# Patient Record
Sex: Female | Born: 2000 | Race: Black or African American | Hispanic: No | Marital: Single | Smoking: Current every day smoker
Health system: Southern US, Community
[De-identification: ages and names within clinical notes are randomized; demographics above are authoritative.]

## PROBLEM LIST (undated history)

## (undated) DIAGNOSIS — I10 Essential (primary) hypertension: Secondary | ICD-10-CM

## (undated) DIAGNOSIS — T7840XA Allergy, unspecified, initial encounter: Secondary | ICD-10-CM

## (undated) DIAGNOSIS — E119 Type 2 diabetes mellitus without complications: Secondary | ICD-10-CM

## (undated) HISTORY — DX: Allergy, unspecified, initial encounter: T78.40XA

---
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
## 2019-03-28 DIAGNOSIS — J309 Allergic rhinitis, unspecified: Secondary | ICD-10-CM | POA: Diagnosis not present

## 2019-03-28 DIAGNOSIS — R439 Unspecified disturbances of smell and taste: Secondary | ICD-10-CM | POA: Diagnosis not present

## 2019-03-28 DIAGNOSIS — R05 Cough: Secondary | ICD-10-CM | POA: Diagnosis not present

## 2019-03-28 DIAGNOSIS — Z20828 Contact with and (suspected) exposure to other viral communicable diseases: Secondary | ICD-10-CM | POA: Diagnosis not present

## 2019-08-07 ENCOUNTER — Ambulatory Visit: Payer: Medicaid Other | Admitting: Adult Health

## 2019-08-30 ENCOUNTER — Ambulatory Visit: Payer: Medicaid Other | Admitting: Adult Health

## 2019-10-03 ENCOUNTER — Ambulatory Visit: Payer: Medicaid Other | Admitting: Adult Health

## 2019-11-08 ENCOUNTER — Other Ambulatory Visit: Payer: Self-pay

## 2019-11-08 ENCOUNTER — Ambulatory Visit (INDEPENDENT_AMBULATORY_CARE_PROVIDER_SITE_OTHER): Payer: Medicaid Other | Admitting: Adult Health

## 2019-11-08 ENCOUNTER — Other Ambulatory Visit (HOSPITAL_COMMUNITY)
Admission: RE | Admit: 2019-11-08 | Discharge: 2019-11-08 | Disposition: A | Discharge status: Home / Self Care | Payer: Medicaid Other | Source: Ambulatory Visit | Attending: Adult Health | Admitting: Adult Health

## 2019-11-08 ENCOUNTER — Encounter: Payer: Self-pay | Admitting: Adult Health

## 2019-11-08 VITALS — BP 105/66 | HR 90 | Temp 97.7°F | Resp 16 | Ht 65.0 in | Wt 304.0 lb

## 2019-11-08 DIAGNOSIS — Z113 Encounter for screening for infections with a predominantly sexual mode of transmission: Secondary | ICD-10-CM

## 2019-11-08 DIAGNOSIS — N898 Other specified noninflammatory disorders of vagina: Secondary | ICD-10-CM | POA: Diagnosis not present

## 2019-11-08 DIAGNOSIS — Z01419 Encounter for gynecological examination (general) (routine) without abnormal findings: Secondary | ICD-10-CM | POA: Insufficient documentation

## 2019-11-08 DIAGNOSIS — B9689 Other specified bacterial agents as the cause of diseases classified elsewhere: Secondary | ICD-10-CM | POA: Diagnosis not present

## 2019-11-08 DIAGNOSIS — N760 Acute vaginitis: Secondary | ICD-10-CM | POA: Insufficient documentation

## 2019-11-08 DIAGNOSIS — E559 Vitamin D deficiency, unspecified: Secondary | ICD-10-CM

## 2019-11-08 DIAGNOSIS — Z1389 Encounter for screening for other disorder: Secondary | ICD-10-CM

## 2019-11-08 DIAGNOSIS — R7309 Other abnormal glucose: Secondary | ICD-10-CM | POA: Diagnosis not present

## 2019-11-08 DIAGNOSIS — R5383 Other fatigue: Secondary | ICD-10-CM | POA: Diagnosis not present

## 2019-11-08 DIAGNOSIS — Z13220 Encounter for screening for lipoid disorders: Secondary | ICD-10-CM | POA: Diagnosis not present

## 2019-11-08 DIAGNOSIS — R82998 Other abnormal findings in urine: Secondary | ICD-10-CM | POA: Diagnosis not present

## 2019-11-08 DIAGNOSIS — Z6841 Body Mass Index (BMI) 40.0 and over, adult: Secondary | ICD-10-CM

## 2019-11-08 LAB — POCT URINALYSIS DIPSTICK
Appearance: ABNORMAL
Bilirubin, UA: NEGATIVE
Glucose, UA: NEGATIVE
Ketones, UA: NEGATIVE
Nitrite, UA: NEGATIVE
Odor: NORMAL
Protein, UA: NEGATIVE
Spec Grav, UA: 1.01 (ref 1.010–1.025)
Urobilinogen, UA: 0.2 E.U./dL
pH, UA: 6 (ref 5.0–8.0)

## 2019-11-08 MED ORDER — METRONIDAZOLE 0.75 % VA GEL: 1.0000 | Freq: Every day | VAGINAL | 0 refills | Status: AC

## 2019-11-08 NOTE — Patient Instructions (Addendum)
Nice to meet you today.  Remember you can go or fasting labs at our lab on the same floor as our office from 8 am to 4pm  Monday to Friday closed for lunch at 12- 120 and closed major holidays.   Return for full physical by appointment only and if symptoms persist change or worsen call for follow up at anytime.   Metronidazole vaginal gel What is this medicine? METRONIDAZOLE (me troe NI da zole) VAGINAL GEL is an antiinfective. It is used to treat bacterial vaginitis. This medicine may be used for other purposes; ask your health care provider or pharmacist if you have questions. COMMON BRAND NAME(S): MetroGel, MetroGel Vaginal, MetroGel-Vaginal, NUVESSA, Vandazole What should I tell my health care provider before I take this medicine? They need to know if you have any of these conditions:  Cockayne syndrome  history of blood diseases, like sickle cell disease or leukemia  history of yeast infection  if you often drink alcohol  liver disease  an unusual or allergic reaction to metronidazole, nitroimidazoles, parabens, or other medicines, foods, dyes, or preservatives  pregnant or trying to get pregnant  breast-feeding How should I use this medicine? This medicine is only for use in the vagina. Do not take by mouth or apply to other areas of the body. Follow the directions on the prescription label. Wash hands before and after use. Screw the applicator to the tube and squeeze the tube gently to fill the applicator. Lie on your back, part and bend your knees. Insert the applicator tip high in the vagina and push the plunger to release the gel into the vagina. Gently remove the applicator. Wash the applicator well with warm water and soap. Use at regular intervals. Finish the full course prescribed by your doctor or health care professional even if you think your condition is better. Do not stop using except on the advice of your doctor or health care professional. Talk to your pediatrician  regarding the use of this medicine in children. While this drug maybe prescribed for children as young as 12 years for selected conditions, precautions do apply. Overdosage: If you think you have taken too much of this medicine contact a poison control center or emergency room at once. NOTE: This medicine is only for you. Do not share this medicine with others. What if I miss a dose? If you miss a dose, use it as soon as you can. If it is almost time for your next dose, use only that dose. Do not use double or extra doses. What may interact with this medicine? Do not take this medicine with any of the following medications:  alcohol or any product that contains alcohol  cisapride  disulfiram  dronedarone  pimozide  thioridazine This medicine may also interact with the following medications:  amiodarone  birth control pills  busulfan  carbamazepine  cimetidine  cyclosporine  fluorouracil  lithium  other medicines that prolong the QT interval (cause an abnormal heart rhythm) like dofetilide, ziprasidone  phenobarbital  phenytoin  quinidine  tacrolimus  vecuronium  warfarin This list may not describe all possible interactions. Give your health care provider a list of all the medicines, herbs, non-prescription drugs, or dietary supplements you use. Also tell them if you smoke, drink alcohol, or use illegal drugs. Some items may interact with your medicine. What should I watch for while using this medicine? Tell your doctor or health care professional if your symptoms do not improve or if they get  worse. You may get drowsy or dizzy. Do not drive, use machinery, or do anything that needs mental alertness until you know how this medicine affects you. Do not stand or sit up quickly, especially if you are an older patient. This reduces the risk of dizzy or fainting spells. Ask your doctor or health care professional if you should avoid alcohol. Many nonprescription cough  and cold products contain alcohol. Metronidazole can cause an unpleasant reaction when taken with alcohol. The reaction includes flushing, headache, nausea, vomiting, sweating, and increased thirst. The reaction can last from 30 minutes to several hours. If you are being treated for a sexually transmitted disease, avoid sexual contact until you have finished your treatment. Your sexual partner may also need treatment. What side effects may I notice from receiving this medicine? Side effects that you should report to your doctor or health care professional as soon as possible:  allergic reactions like skin rash, itching or hives, swelling of the face, lips, or tongue  confusion  fast, irregular heartbeat  fever, chills, sore throat  fever with rash, swollen lymph nodes, or swelling of the face  pain, tingling, numbness in the hands or feet  redness, blistering, peeling or loosening of the skin, including inside the mouth  seizures  sign and symptoms of liver injury like dark yellow or brown urine; general ill feeling or flu-like symptoms; light colored stools; loss of appetite; nausea; right upper belly pain; unusually weak or tired; yellowing of the eyes or skin  vaginal discharge, itching, or odor in women Side effects that usually do not require medical attention (report to your doctor or health care professional if they continue or are bothersome):  changes in taste  diarrhea  headache  nausea, vomiting  stomach pain This list may not describe all possible side effects. Call your doctor for medical advice about side effects. You may report side effects to FDA at 1-800-FDA-1088. Where should I keep my medicine? Keep out of the reach of children. Store at room temperature between 15 and 30 degrees C (59 and 86 degrees F). Do not freeze. Throw away any unused medicine after the expiration date. NOTE: This sheet is a summary. It may not cover all possible information. If you  have questions about this medicine, talk to your doctor, pharmacist, or health care provider.  2020 Elsevier/Gold Standard (2018-05-15 06:53:27)  Bacterial Vaginosis  Bacterial vaginosis is an infection of the vagina. It happens when too many normal germs (healthy bacteria) grow in the vagina. This infection puts you at risk for infections from sex (STIs). Treating this infection can lower your risk for some STIs. You should also treat this if you are pregnant. It can cause your baby to be born early. Follow these instructions at home: Medicines  Take over-the-counter and prescription medicines only as told by your doctor.  Take or use your antibiotic medicine as told by your doctor. Do not stop taking or using it even if you start to feel better. General instructions  If you your sexual partner is a woman, tell her that you have this infection. She needs to get treatment if she has symptoms. If you have a female partner, he does not need to be treated.  During treatment: ? Avoid sex. ? Do not douche. ? Avoid alcohol as told. ? Avoid breastfeeding as told.  Drink enough fluid to keep your pee (urine) clear or pale yellow.  Keep your vagina and butt (rectum) clean. ? Wash the area with  warm water every day. ? Wipe from front to back after you use the toilet.  Keep all follow-up visits as told by your doctor. This is important. Preventing this condition  Do not douche.  Use only warm water to wash around your vagina.  Use protection when you have sex. This includes: ? Latex condoms. ? Dental dams.  Limit how many people you have sex with. It is best to only have sex with the same person (be monogamous).  Get tested for STIs. Have your partner get tested.  Wear underwear that is cotton or lined with cotton.  Avoid tight pants and pantyhose. This is most important in summer.  Do not use any products that have nicotine or tobacco in them. These include cigarettes and  e-cigarettes. If you need help quitting, ask your doctor.  Do not use illegal drugs.  Limit how much alcohol you drink. Contact a doctor if:  Your symptoms do not get better, even after you are treated.  You have more discharge or pain when you pee (urinate).  You have a fever.  You have pain in your belly (abdomen).  You have pain with sex.  Your bleed from your vagina between periods. Summary  This infection happens when too many germs (bacteria) grow in the vagina.  Treating this condition can lower your risk for some infections from sex (STIs).  You should also treat this if you are pregnant. It can cause early (premature) birth.  Do not stop taking or using your antibiotic medicine even if you start to feel better. This information is not intended to replace advice given to you by your health care provider. Make sure you discuss any questions you have with your health care provider. Document Revised: 05/05/2017 Document Reviewed: 02/06/2016 Elsevier Patient Education  2020 [employer].   Health Maintenance, Female Adopting a healthy lifestyle and getting preventive care are important in promoting health and wellness. Ask your health care provider about:  The right schedule for you to have regular tests and exams.  Things you can do on your own to prevent diseases and keep yourself healthy. What should I know about diet, weight, and exercise? Eat a healthy diet   Eat a diet that includes plenty of vegetables, fruits, low-fat dairy products, and lean protein.  Do not eat a lot of foods that are high in solid fats, added sugars, or sodium. Maintain a healthy weight Body mass index (BMI) is used to identify weight problems. It estimates body fat based on height and weight. Your health care provider can help determine your BMI and help you achieve or maintain a healthy weight. Get regular exercise Get regular exercise. This is one of the most important things you can  do for your health. Most adults should:  Exercise for at least 150 minutes each week. The exercise should increase your heart rate and make you sweat (moderate-intensity exercise).  Do strengthening exercises at least twice a week. This is in addition to the moderate-intensity exercise.  Spend less time sitting. Even light physical activity can be beneficial. Watch cholesterol and blood lipids Have your blood tested for lipids and cholesterol at 20 years of age, then have this test every 5 years. Have your cholesterol levels checked more often if:  Your lipid or cholesterol levels are high.  You are older than 20 years of age.  You are at high risk for heart disease. What should I know about cancer screening? Depending on your health history and  family history, you may need to have cancer screening at various ages. This may include screening for:  Breast cancer.  Cervical cancer.  Colorectal cancer.  Skin cancer.  Lung cancer. What should I know about heart disease, diabetes, and high blood pressure? Blood pressure and heart disease  High blood pressure causes heart disease and increases the risk of stroke. This is more likely to develop in people who have high blood pressure readings, are of African descent, or are overweight.  Have your blood pressure checked: ? Every 3-5 years if you are 25-37 years of age. ? Every year if you are 57 years old or older. Diabetes Have regular diabetes screenings. This checks your fasting blood sugar level. Have the screening done:  Once every three years after age 43 if you are at a normal weight and have a low risk for diabetes.  More often and at a younger age if you are overweight or have a high risk for diabetes. What should I know about preventing infection? Hepatitis B If you have a higher risk for hepatitis B, you should be screened for this virus. Talk with your health care provider to find out if you are at risk for hepatitis B  infection. Hepatitis C Testing is recommended for:  Everyone born from [birth year] through 1966.  Anyone with known risk factors for hepatitis C. Sexually transmitted infections (STIs)  Get screened for STIs, including gonorrhea and chlamydia, if: ? You are sexually active and are younger than 20 years of age. ? You are older than 20 years of age and your health care provider tells you that you are at risk for this type of infection. ? Your sexual activity has changed since you were last screened, and you are at increased risk for chlamydia or gonorrhea. Ask your health care provider if you are at risk.  Ask your health care provider about whether you are at high risk for HIV. Your health care provider may recommend a prescription medicine to help prevent HIV infection. If you choose to take medicine to prevent HIV, you should first get tested for HIV. You should then be tested every 3 months for as long as you are taking the medicine. Pregnancy  If you are about to stop having your period (premenopausal) and you may become pregnant, seek counseling before you get pregnant.  Take 400 to 800 micrograms (mcg) of folic acid every day if you become pregnant.  Ask for birth control (contraception) if you want to prevent pregnancy. Osteoporosis and menopause Osteoporosis is a disease in which the bones lose minerals and strength with aging. This can result in bone fractures. If you are 52 years old or older, or if you are at risk for osteoporosis and fractures, ask your health care provider if you should:  Be screened for bone loss.  Take a calcium or vitamin D supplement to lower your risk of fractures.  Be given hormone replacement therapy (HRT) to treat symptoms of menopause. Follow these instructions at home: Lifestyle  Do not use any products that contain nicotine or tobacco, such as cigarettes, e-cigarettes, and chewing tobacco. If you need help quitting, ask your health care  provider.  Do not use street drugs.  Do not share needles.  Ask your health care provider for help if you need support or information about quitting drugs. Alcohol use  Do not drink alcohol if: ? Your health care provider tells you not to drink. ? You are pregnant, may be pregnant,  or are planning to become pregnant.  If you drink alcohol: ? Limit how much you use to 0-1 drink a day. ? Limit intake if you are breastfeeding.  Be aware of how much alcohol is in your drink. In the [place] one drink equals one 12 oz bottle of beer (355 mL), one 5 oz glass of wine (148 mL), or one 1 oz glass of hard liquor (44 mL). General instructions  Schedule regular health, dental, and eye exams.  Stay current with your vaccines.  Tell your health care provider if: ? You often feel depressed. ? You have ever been abused or do not feel safe at home. Summary  Adopting a healthy lifestyle and getting preventive care are important in promoting health and wellness.  Follow your health care provider's instructions about healthy diet, exercising, and getting tested or screened for diseases.  Follow your health care provider's instructions on monitoring your cholesterol and blood pressure. This information is not intended to replace advice given to you by your health care provider. Make sure you discuss any questions you have with your health care provider. Document Revised: 05/16/2018 Document Reviewed: 05/16/2018 Elsevier Patient Education  2020 [employer].  Vaginitis Vaginitis is a condition in which the vaginal tissue swells and becomes red (inflamed). This condition is most often caused by a change in the normal balance of bacteria and yeast that live in the vagina. This change causes an overgrowth of certain bacteria or yeast, which causes the inflammation. There are different types of vaginitis, but the most common types are:  Bacterial vaginosis.  Yeast infection  (candidiasis).  Trichomoniasis vaginitis. This is a sexually transmitted disease (STD).  Viral vaginitis.  Atrophic vaginitis.  Allergic vaginitis. What are the causes? The cause of this condition depends on the type of vaginitis. It can be caused by:  Bacteria (bacterial vaginosis).  Yeast, which is a fungus (yeast infection).  A parasite (trichomoniasis vaginitis).  A virus (viral vaginitis).  Low hormone levels (atrophic vaginitis). Low hormone levels can occur during pregnancy, breastfeeding, or after menopause.  Irritants, such as bubble baths, scented tampons, and feminine sprays (allergic vaginitis). Other factors can change the normal balance of the yeast and bacteria that live in the vagina. These include:  Antibiotic medicines.  Poor hygiene.  Diaphragms, vaginal sponges, spermicides, birth control pills, and intrauterine devices (IUD).  Sex.  Infection.  Uncontrolled diabetes.  A weakened defense (immune) system. What increases the risk? This condition is more likely to develop in women who:  Smoke.  Use vaginal douches, scented tampons, or scented sanitary pads.  Wear tight-fitting pants.  Wear thong underwear.  Use oral birth control pills or an IUD.  Have sex without a condom.  Have multiple sex partners.  Have an STD.  Frequently use the spermicide nonoxynol-9.  Eat lots of foods high in sugar.  Have uncontrolled diabetes.  Have low estrogen levels.  Have a weakened immune system from an immune disorder or medical treatment.  Are pregnant or breastfeeding. What are the signs or symptoms? Symptoms vary depending on the cause of the vaginitis. Common symptoms include:  Abnormal vaginal discharge. ? The discharge is white, gray, or yellow with bacterial vaginosis. ? The discharge is thick, white, and cheesy with a yeast infection. ? The discharge is frothy and yellow or greenish with trichomoniasis.  A bad vaginal smell. The  smell is fishy with bacterial vaginosis.  Vaginal itching, pain, or swelling.  Sex that is painful.  Pain or burning  when urinating. Sometimes there are no symptoms. How is this diagnosed? This condition is diagnosed based on your symptoms and medical history. A physical exam, including a pelvic exam, will also be done. You may also have other tests, including:  Tests to determine the pH level (acidity or alkalinity) of your vagina.  A whiff test, to assess the odor that results when a sample of your vaginal discharge is mixed with a potassium hydroxide solution.  Tests of vaginal fluid. A sample will be examined under a microscope. How is this treated? Treatment varies depending on the type of vaginitis you have. Your treatment may include:  Antibiotic creams or pills to treat bacterial vaginosis and trichomoniasis.  Antifungal medicines, such as vaginal creams or suppositories, to treat a yeast infection.  Medicine to ease discomfort if you have viral vaginitis. Your sexual partner should also be treated.  Estrogen delivered in a cream, pill, suppository, or vaginal ring to treat atrophic vaginitis. If vaginal dryness occurs, lubricants and moisturizing creams may help. You may need to avoid scented soaps, sprays, or douches.  Stopping use of a product that is causing allergic vaginitis. Then using a vaginal cream to treat the symptoms. Follow these instructions at home: Lifestyle  Keep your genital area clean and dry. Avoid soap, and only rinse the area with water.  Do not douche or use tampons until your health care provider says it is okay to do so. Use sanitary pads, if needed.  Do not have sex until your health care provider approves. When you can return to sex, practice safe sex and use condoms.  Wipe from front to back. This avoids the spread of bacteria from the rectum to the vagina. General instructions  Take over-the-counter and prescription medicines only as told  by your health care provider.  If you were prescribed an antibiotic medicine, take or use it as told by your health care provider. Do not stop taking or using the antibiotic even if you start to feel better.  Keep all follow-up visits as told by your health care provider. This is important. How is this prevented?  Use mild, non-scented products. Do not use things that can irritate the vagina, such as fabric softeners. Avoid the following products if they are scented: ? Feminine sprays. ? Detergents. ? Tampons. ? Feminine hygiene products. ? Soaps or bubble baths.  Let air reach your genital area. ? Wear cotton underwear to reduce moisture buildup. ? Avoid wearing underwear while you sleep. ? Avoid wearing tight pants and underwear or nylons without a cotton panel. ? Avoid wearing thong underwear.  Take off any wet clothing, such as bathing suits, as soon as possible.  Practice safe sex and use condoms. Contact a health care provider if:  You have abdominal pain.  You have a fever.  You have symptoms that last for more than 2-3 days. Get help right away if:  You have a fever and your symptoms suddenly get worse. Summary  Vaginitis is a condition in which the vaginal tissue becomes inflamed.This condition is most often caused by a change in the normal balance of bacteria and yeast that live in the vagina.  Treatment varies depending on the type of vaginitis you have.  Do not douche, use tampons , or have sex until your health care provider approves. When you can return to sex, practice safe sex and use condoms. This information is not intended to replace advice given to you by your health care provider. Make sure  you discuss any questions you have with your health care provider. Document Revised: 05/05/2017 Document Reviewed: 06/28/2016 Elsevier Patient Education  2020 [place] and Cholesterol Restricted Eating Plan Getting too much fat and cholesterol in your  diet may cause health problems. Choosing the right foods helps keep your fat and cholesterol at normal levels. This can keep you from getting certain diseases. Your doctor may recommend an eating plan that includes:  Total fat: ______% or less of total calories a day.  Saturated fat: ______% or less of total calories a day.  Cholesterol: less than _________mg a day.  Fiber: ______g a day. What are tips for following this plan? Meal planning  At meals, divide your plate into four equal parts: ? Fill one-half of your plate with vegetables and green salads. ? Fill one-fourth of your plate with whole grains. ? Fill one-fourth of your plate with low-fat (lean) protein foods.  Eat fish that is high in omega-3 fats at least two times a week. This includes mackerel, tuna, sardines, and salmon.  Eat foods that are high in fiber, such as whole grains, beans, apples, broccoli, carrots, peas, and barley. General tips   Work with your doctor to lose weight if you need to.  Avoid: ? Foods with added sugar. ? Fried foods. ? Foods with partially hydrogenated oils.  Limit alcohol intake to no more than 1 drink a day for nonpregnant women and 2 drinks a day for men. One drink equals 12 oz of beer, 5 oz of wine, or 1 oz of hard liquor. Reading food labels  Check food labels for: ? Trans fats. ? Partially hydrogenated oils. ? Saturated fat (g) in each serving. ? Cholesterol (mg) in each serving. ? Fiber (g) in each serving.  Choose foods with healthy fats, such as: ? Monounsaturated fats. ? Polyunsaturated fats. ? Omega-3 fats.  Choose grain products that have whole grains. Look for the word "whole" as the first word in the ingredient list. Cooking  Cook foods using low-fat methods. These include baking, boiling, grilling, and broiling.  Eat more home-cooked foods. Eat at restaurants and buffets less often.  Avoid cooking using saturated fats, such as butter, cream, palm oil, palm  kernel oil, and coconut oil. Recommended foods  Fruits  All fresh, canned (in natural juice), or frozen fruits. Vegetables  Fresh or frozen vegetables (raw, steamed, roasted, or grilled). Green salads. Grains  Whole grains, such as whole wheat or whole grain breads, crackers, cereals, and pasta. Unsweetened oatmeal, bulgur, barley, quinoa, or brown rice. Corn or whole wheat flour tortillas. Meats and other protein foods  Ground beef (85% or leaner), grass-fed beef, or beef trimmed of fat. Skinless chicken or [place]. Ground chicken or [place]. Pork trimmed of fat. All fish and seafood. Egg whites. Dried beans, peas, or lentils. Unsalted nuts or seeds. Unsalted canned beans. Nut butters without added sugar or oil. Dairy  Low-fat or nonfat dairy products, such as skim or 1% milk, 2% or reduced-fat cheeses, low-fat and fat-free ricotta or cottage cheese, or plain low-fat and nonfat yogurt. Fats and oils  Tub margarine without trans fats. Light or reduced-fat mayonnaise and salad dressings. Avocado. Olive, canola, sesame, or safflower oils. The items listed above may not be a complete list of foods and beverages you can eat. Contact a dietitian for more information. Foods to avoid Fruits  Canned fruit in heavy syrup. Fruit in cream or butter sauce. Fried fruit. Vegetables  Vegetables cooked in cheese, cream,  or butter sauce. Fried vegetables. Grains  White bread. White pasta. White rice. Cornbread. Bagels, pastries, and croissants. Crackers and snack foods that contain trans fat and hydrogenated oils. Meats and other protein foods  Fatty cuts of meat. Ribs, chicken wings, bacon, sausage, bologna, salami, chitterlings, fatback, hot dogs, bratwurst, and packaged lunch meats. Liver and organ meats. Whole eggs and egg yolks. Chicken and [place] with skin. Fried meat. Dairy  Whole or 2% milk, cream, half-and-half, and cream cheese. Whole milk cheeses. Whole-fat or sweetened yogurt. Full-fat  cheeses. Nondairy creamers and whipped toppings. Processed cheese, cheese spreads, and cheese curds. Beverages  Alcohol. Sugar-sweetened drinks such as sodas, lemonade, and fruit drinks. Fats and oils  Butter, stick margarine, lard, shortening, ghee, or bacon fat. Coconut, palm kernel, and palm oils. Sweets and desserts  Corn syrup, sugars, honey, and molasses. Candy. Jam and jelly. Syrup. Sweetened cereals. Cookies, pies, cakes, donuts, muffins, and ice cream. The items listed above may not be a complete list of foods and beverages you should avoid. Contact a dietitian for more information. Summary  Choosing the right foods helps keep your fat and cholesterol at normal levels. This can keep you from getting certain diseases.  At meals, fill one-half of your plate with vegetables and green salads.  Eat high-fiber foods, like whole grains, beans, apples, carrots, peas, and barley.  Limit added sugar, saturated fats, alcohol, and fried foods. This information is not intended to replace advice given to you by your health care provider. Make sure you discuss any questions you have with your health care provider. Document Revised: 01/24/2018 Document Reviewed: 02/07/2017 Elsevier Patient Education  2020 [employer].

## 2019-11-08 NOTE — Progress Notes (Signed)
New patient visit   I,Jenny Weber,acting as a scribe for [employer], Weber.,have documented all relevant documentation on the behalf of Jenny Weber,as directed by  Jenny Weber while in the presence of Jenny Weber.   Patient: Jenny Weber   DOB: [birth year]   19 y.o. Female  MRN: [id number] Visit Date: 11/08/2019  Today's healthcare provider: Jairo Ben, Weber   Chief Complaint  Patient presents with  . Establish Care   Subjective    Jenny Weber is a 19 y.o. female who presents today as a new patient to establish care.  HPI  Patient is a 19 year old female in no acute distress who comes to the clinic with vaginal discharge, started January. She reports some mild itching, thick white discharge, Denies any urinary symptoms.  She said it was tender when she started to have sex on the week of Jenny  4th and vagina felt tender so she stopped. Denies intercourse since.   .She is sexually active. One partner not very active she reports.  Denies any pain currently, or any trauma/ injury.  Patient's last menstrual period was 10/23/2019.  Denies breast  Feeding.  Denies any recent antibiotics or infections. She denies any known exposures but is not opposed to testing for STD's.   Patient  denies any fever, body aches,chills, rash, chest pain, shortness of breath, nausea, vomiting, or diarrhea.   Denies dizziness, lightheadedness, pre syncopal or syncopal episodes.     Past Medical History:  Diagnosis Date  . Allergy   . Diabetes mellitus without complication (HCC)    BORDERLINE  . Hypertension    History reviewed. No pertinent surgical history. Family Status  Relation Name Status  . Father  (Not Specified)  . MGM  (Not Specified)  . MGF  (Not Specified)  . PGM  (Not Specified)  . PGF  (Not Specified)   Family History  Problem Relation Age of Onset  . Hypertension Father   . Hypertension  Maternal Grandmother   . Hypertension Maternal Grandfather   . Hypertension Paternal Grandmother   . Hypertension Paternal Grandfather    Social History   Socioeconomic History  . Marital status: Single    Spouse name: Not on file  . Number of children: Not on file  . Years of education: Not on file  . Highest education level: Not on file  Occupational History  . Not on file  Tobacco Use  . Smoking status: Current Every Day Smoker    Types: Cigarettes  . Smokeless tobacco: Never Used  Substance and Sexual Activity  . Alcohol use: No  . Drug use: No  . Sexual activity: Never    Birth control/protection: None  Other Topics Concern  . Not on file  Social History Narrative  . Not on file   Social Determinants of Health   Financial Resource Strain:   . Difficulty of Paying Living Expenses:   Food Insecurity:   . Worried About Programme researcher, broadcasting/film/video in the Last Year:   . Barista in the Last Year:   Transportation Needs:   . Freight forwarder (Medical):   Marland Kitchen Lack of Transportation (Non-Medical):   Physical Activity:   . Days of Exercise per Week:   . Minutes of Exercise per Session:   Stress:   . Feeling of Stress :   Social Connections:   . Frequency of Communication with Friends and Family:   .  Frequency of Social Gatherings with Friends and Family:   . Attends Religious Services:   . Active Member of Clubs or Organizations:   . Attends Banker Meetings:   Marland Kitchen Marital Status:    Outpatient Medications Prior to Visit  Medication Sig  . naproxen (NAPROSYN) 375 MG tablet Take 1 tablet (375 mg total) by mouth 2 (two) times daily. (Patient not taking: Reported on 11/08/2019)   No facility-administered medications prior to visit.   Allergies  Allergen Reactions  . Other Swelling    mushrooms  . Shellfish Allergy Itching and Swelling     There is no immunization history on file for this patient.  Health Maintenance  Topic Date Due  .  COVID-19 Vaccine (1) Never done  . HIV Screening  Never done  . TETANUS/TDAP  Never done  . INFLUENZA VACCINE  01/05/2020    Patient Care Team: Berniece Pap, Weber as PCP - General (Family Medicine)  Review of Systems  Eyes: Positive for photophobia.  Cardiovascular: Positive for leg swelling.  Gastrointestinal: Positive for abdominal distention.  Endocrine: Positive for polydipsia and polyuria.  Genitourinary: Positive for vaginal discharge and vaginal pain.  Musculoskeletal: Positive for back pain.  Allergic/Immunologic: Positive for environmental allergies.  both legs swell at times thinks from being on feet, ankles swell bilaterally at time.   Last CBC Lab Results  Component Value Date   WBC 11.1 (H) 03/15/2018   HGB 11.6 (L) 03/15/2018   HCT 36.4 03/15/2018   MCV 81.6 03/15/2018   MCH 26.0 03/15/2018   RDW 14.9 03/15/2018   PLT 279 03/15/2018   Last metabolic panel Lab Results  Component Value Date   GLUCOSE 106 (H) 03/15/2018   NA 139 03/15/2018   K 4.0 03/15/2018   CL 108 03/15/2018   CO2 23 03/15/2018   BUN 10 03/15/2018   CREATININE 0.69 03/15/2018   GFRNONAA >60 03/15/2018   GFRAA >60 03/15/2018   CALCIUM 9.4 03/15/2018   ANIONGAP 8 03/15/2018      Objective    BP 105/66 (BP Location: Left Arm, Patient Position: Sitting, Cuff Size: Large)   Pulse 90   Temp 97.7 F (36.5 C) (Other (Comment))   Resp 16   Ht 5\' 5"  (1.651 m)   Wt (!) 304 lb (137.9 kg)   LMP 10/23/2019   SpO2 95%   BMI 50.59 kg/m  Physical Exam Exam conducted with a chaperone present.  Constitutional:      General: She is not in acute distress.    Appearance: Normal appearance. She is obese. She is not ill-appearing, toxic-appearing or diaphoretic.     Comments: Patient is alert and oriented and responsive to questions Engages in eye contact with provider. Speaks in full sentences without any pauses without any shortness of breath or distress.    HENT:     Head:  Normocephalic and atraumatic.     Right Ear: External ear normal.     Left Ear: External ear normal.     Nose: Nose normal.     Mouth/Throat:     Mouth: Mucous membranes are moist.  Eyes:     Pupils: Pupils are equal, round, and reactive to light.  Cardiovascular:     Rate and Rhythm: Normal rate and regular rhythm.     Pulses: Normal pulses.     Heart sounds: Normal heart sounds.  Pulmonary:     Effort: Pulmonary effort is normal.     Breath sounds: Normal breath sounds.  Abdominal:     General: There is no distension.     Palpations: Abdomen is soft.     Tenderness: There is no abdominal tenderness.     Hernia: There is no hernia in the left inguinal area or right inguinal area.  Genitourinary:    General: Normal vulva.     Pubic Area: No rash or pubic lice.      Tanner stage (genital): 5.     Labia:        Right: No rash.        Left: No rash.      Urethra: No prolapse.     Vagina: No signs of injury and foreign body. Vaginal discharge present. No erythema, tenderness, lesions or prolapsed vaginal walls.     Cervix: Discharge (white discharge with slight odor ) present. No cervical motion tenderness, friability, lesion, erythema, cervical bleeding or eversion.  Musculoskeletal:     Cervical back: Normal range of motion and neck supple. No rigidity or tenderness.  Lymphadenopathy:     Cervical: No cervical adenopathy.  Skin:    General: Skin is warm and dry.     Capillary Refill: Capillary refill takes less than 2 seconds.  Neurological:     General: No focal deficit present.     Mental Status: She is alert and oriented to person, place, and time.  Psychiatric:        Mood and Affect: Mood normal.        Behavior: Behavior normal.        Thought Content: Thought content normal.        Judgment: Judgment normal.      Depression Screen PHQ 2/9 Scores 11/08/2019  PHQ - 2 Score 2  PHQ- 9 Score 15   No results found for any visits on 11/08/19.  Assessment & Plan       The primary encounter diagnosis was Elevated hemoglobin A1c measurement. Diagnoses of Screening for blood or protein in urine, Vitamin D deficiency- 19.1 on 04/26/18, Vaginal discharge, Screening examination for STD (sexually transmitted disease), Fatigue, unspecified type, Body mass index (BMI) of 50-59.9 in adult [facility]), Screening cholesterol level, Bacterial vaginosis, Vaginal odor, and Leukocytes in urine were also pertinent to this visit.   Orders Placed This Encounter  Procedures  . CULTURE, URINE COMPREHENSIVE  . Lipid panel  . TSH  . CBC w/Diff/Platelet  . Comprehensive Metabolic Panel (CMET)  . HgB A1c  . VITAMIN D 25 Hydroxy (Vit-D Deficiency, Fractures)  . HIV antibody (with reflex)  . Hepatitis panel, acute  . RPR w/reflex to TrepSure  . POCT urinalysis dipstick    Meds ordered this encounter  Medications  . metroNIDAZOLE (METROGEL VAGINAL) 0.75 % vaginal gel    Sig: Place 1 Applicatorful vaginally at bedtime for 5 days. No intercourse advised while using.    Dispense:  50 g    Refill:  0    Will return for fasting labs and CPE/ not due for PAP as of age and no abnormal bleeding.   Will treat for suspected bacterial vaginosis/ and await testing.   Return to office if worsens. No sex while being treated recommended.   Return in about 1 month (around 12/08/2019), or if symptoms worsen or fail to improve also for full physical, for at any time for any worsening symptoms.      Advised patient call the office or your primary care doctor for an appointment if no improvement within 72 hours or if any symptoms  change or worsen at any time  Advised ER or urgent Care if after hours or on weekend. Call 911 for emergency symptoms at any time.Patinet verbalized understanding of all instructions given/reviewed and treatment plan and has no further questions or concerns at this time.    Healthy lifestyle advised. An After Visit Summary was printed and given to the patient.  IBeverely Pace Latacha Texeira, Weber, have reviewed all documentation for this visit. The documentation on 11/08/19 for the exam, diagnosis, procedures, and orders are all accurate and complete.   Jenny Weber  [facility] [phone] (phone) [phone] (fax)  [facility]

## 2019-11-11 NOTE — Progress Notes (Signed)
Mixed flora on culture- no treatment needed if no symptoms. If culture returns any new growth office will call. Follow up if any new, worsening or persistent symptoms with office or medical facility at anytime.

## 2019-11-12 ENCOUNTER — Encounter: Payer: Self-pay | Admitting: Adult Health

## 2019-11-12 ENCOUNTER — Other Ambulatory Visit: Payer: Self-pay | Admitting: Adult Health

## 2019-11-12 DIAGNOSIS — A599 Trichomoniasis, unspecified: Secondary | ICD-10-CM

## 2019-11-12 DIAGNOSIS — A749 Chlamydial infection, unspecified: Secondary | ICD-10-CM | POA: Insufficient documentation

## 2019-11-12 DIAGNOSIS — B9689 Other specified bacterial agents as the cause of diseases classified elsewhere: Secondary | ICD-10-CM

## 2019-11-12 DIAGNOSIS — B3731 Acute candidiasis of vulva and vagina: Secondary | ICD-10-CM

## 2019-11-12 LAB — CERVICOVAGINAL ANCILLARY ONLY
Bacterial Vaginitis (gardnerella): POSITIVE — AB
Candida Glabrata: NEGATIVE
Candida Vaginitis: NEGATIVE
Chlamydia: POSITIVE — AB
Comment: NEGATIVE
Comment: NEGATIVE
Comment: NEGATIVE
Comment: NEGATIVE
Comment: NEGATIVE
Comment: NORMAL
Neisseria Gonorrhea: NEGATIVE
Trichomonas: POSITIVE — AB

## 2019-11-12 MED ORDER — TERCONAZOLE 0.8 % VA CREA: 1.0000 | TOPICAL_CREAM | Freq: Every day | VAGINAL | 0 refills | Status: DC

## 2019-11-12 MED ORDER — AZITHROMYCIN 500 MG PO TABS: 1000.0000 mg | ORAL_TABLET | Freq: Every day | ORAL | 0 refills | Status: DC

## 2019-11-12 MED ORDER — DOXYCYCLINE HYCLATE 100 MG PO TABS: 100.0000 mg | ORAL_TABLET | Freq: Two times a day (BID) | ORAL | 0 refills | Status: DC

## 2019-11-12 MED ORDER — METRONIDAZOLE 500 MG PO TABS: 2000.0000 mg | ORAL_TABLET | Freq: Once | ORAL | 0 refills | Status: AC

## 2019-11-12 NOTE — Progress Notes (Signed)
Prescribed Flagyl Vaginal  for Bacterial Vaginosis at visit 11/08/2019.  11/12/2019 sent to pharmacy. Meds ordered this encounter  Medications  . azithromycin (ZITHROMAX) 500 MG tablet    Sig: Take 2 tablets (1,000 mg total) by mouth daily.    Dispense:  2 tablet    Refill:  0  . terconazole (TERAZOL 3) 0.8 % vaginal cream    Sig: Place 1 applicator vaginally at bedtime for 3 days.    Dispense:  20 g    Refill:  0    Bacterial vaginosis  Chlamydia  Yeast vaginitis  Cervicovaginal ancillary only Order: [id number] Status:  Edited Result - FINAL Visible to patient:  No (scheduled for 11/12/2019 12:59 PM) Next appt:  12/27/2019 at 09:00 AM in Family Medicine [facility] Madai Nuccio, FNP) Dx:  Vaginal discharge; Vaginal odor; Bact... Component 4 d ago  Neisseria Gonorrhea Negative   Chlamydia PositiveAbnormal    Trichomonas PositiveAbnormal    Bacterial Vaginitis (gardnerella) PositiveAbnormal    Candida Vaginitis Negative   Candida Glabrata Negative   Comment Normal Reference Range Bacterial Vaginosis - Negative   Comment Normal Reference Range Candida Species - Negative   Comment Normal Reference Range Candida Galbrata - Negative   Comment Normal Reference Range Trichomonas - Negative   Comment Normal Reference Ranger Chlamydia - Negative   Comment Normal Reference Range Neisseria Gonorrhea - Negative   Resulting Agency [facility]      Specimen Collected: 11/08/19 16:16 Last Resulted: 11/12/19 11:59

## 2019-11-12 NOTE — Progress Notes (Signed)
Prescribed Flagyl Vaginal  for Bacterial Vaginosis at visit 11/08/2019. Meds ordered this encounters below- positive for Chlamydia, bacterial vaginosis and trichomonas. No alcohol while being treated. Avoid sexual intercourse for 14 days. Advise all partners they need treatment. Recommend return for repeat testing if any symptoms and for test of cure after treatment - in around 3 weeks.  Medications  metroNIDAZOLE (FLAGYL) 500 MG tablet   Sig: Take 4 tablets (2,000 mg total) by mouth once for 1 dose.   Dispense:  4 tablet   Refill:  0  doxycycline (VIBRA-TABS) 100 MG tablet   Sig: Take 1 tablet (100 mg total) by mouth 2 (two) times daily.   Dispense:  14 tablet   Refill:  0

## 2019-11-12 NOTE — Progress Notes (Unsigned)
Medications Discontinued During This Encounter  Medication Reason   terconazole (TERAZOL 3) 0.8 % vaginal cream Completed Course   azithromycin (ZITHROMAX) 500 MG tablet   spoke with Lorene Dy at [employer] to cancel above.    Meds ordered this encounter  Medications   metroNIDAZOLE (FLAGYL) 500 MG tablet    Sig: Take 4 tablets (2,000 mg total) by mouth once for 1 dose.    Dispense:  4 tablet    Refill:  0   doxycycline (VIBRA-TABS) 100 MG tablet    Sig: Take 1 tablet (100 mg total) by mouth 2 (two) times daily.    Dispense:  14 tablet    Refill:  0    Trichomonosis - Plan: metroNIDAZOLE (FLAGYL) 500 MG tablet  Chlamydia - Plan: doxycycline (VIBRA-TABS) 100 MG tablet

## 2019-11-13 ENCOUNTER — Encounter: Payer: Self-pay | Admitting: Adult Health

## 2019-11-13 LAB — CULTURE, URINE COMPREHENSIVE

## 2019-11-13 NOTE — Progress Notes (Signed)
Urine culture was ok- normal flora. No additional treatment necessary.   Prescribed Flagyl Vaginal for Bacterial Vaginosis at visit 11/08/2019. Meds ordered this encounters below- positive for Chlamydia, bacterial vaginosis and trichomonas. No alcohol while being treated. Avoid sexual intercourse for 14 days. Advise all partners they need treatment. Recommend return for repeat testing if any symptoms and for test of cure after treatment - in around 3 weeks.  Medications  metroNIDAZOLE (FLAGYL) 500 MG tablet Sig: Take 4 tablets (2,000 mg total) by mouth once for 1 dose. Dispense: 4 tablet Refill: 0  doxycycline (VIBRA-TABS) 100 MG tablet Sig: Take 1 tablet (100 mg total) by mouth 2 (two) times daily. Dispense: 14 tablet Refill: 0

## 2019-11-14 ENCOUNTER — Encounter: Payer: Self-pay | Admitting: Adult Health

## 2019-12-04 ENCOUNTER — Ambulatory Visit: Payer: Medicaid Other | Admitting: Adult Health

## 2019-12-04 NOTE — Progress Notes (Deleted)
     Established patient visit   Patient: Jenny Weber   DOB: Dec 02, 2000   19 y.o. Female  MRN: [id number] Visit Date: 12/04/2019  Today's healthcare provider: Jairo Ben, FNP   No chief complaint on file.  Subjective    HPI Follow up for sexually transmitted disease  The patient was last seen for this 3 weeks ago. Changes made at last visit include patient was prescribed Doxycycline to treat for Chlamydia and Flagyl to treat for trichomonas .  She reports {excellent/good/fair/poor:19665} compliance with treatment. She feels that condition is {improved/worse/unchanged:3041574}. She {is/is not:21021397} having side effects. ***  -----------------------------------------------------------------------------------------   Patient Active Problem List   Diagnosis Date Noted  . Chlamydia 11/12/2019  . Yeast vaginitis 11/12/2019  . Elevated hemoglobin A1c measurement 11/08/2019  . Screening cholesterol level 11/08/2019  . Vaginal odor 11/08/2019  . Fatigue 11/08/2019  . Body mass index (BMI) of 50-59.9 in adult (HCC) 11/08/2019  . Bacterial vaginosis 11/08/2019  . Leukocytes in urine 11/08/2019  . Vitamin D deficiency 05/01/2018  . 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 Migraine without aura and with status migrainosus, not intractable 12/17/2015  . Overweight 09/23/2014   Past Medical History:  Diagnosis Date  . Allergy   . Diabetes mellitus without complication (HCC)    BORDERLINE  . Hypertension    No past surgical history on file. Allergies  Allergen Reactions  . Other Swelling    mushrooms  . Shellfish Allergy Itching and Swelling       Medications: Outpatient Medications Prior to Visit  Medication Sig  . doxycycline (VIBRA-TABS) 100 MG tablet Take 1 tablet (100 mg total) by mouth 2 (two) times daily.   No facility-administered medications prior to visit.    Review of Systems  {Heme  Chem  Endocrine  Serology  Results Review  (optional):23779::" "}  Objective    There were no vitals taken for this visit. {Show previous vital signs (optional):23777::" "}  Physical Exam  ***  No results found for any visits on 12/04/19.  Assessment & Plan     ***  No follow-ups on file.      {provider attestation***:1}   Jairo Ben, FNP  [facility] [phone] (phone) [phone] (fax)  [facility]

## 2019-12-27 ENCOUNTER — Encounter: Payer: Medicaid Other | Admitting: Adult Health

## 2020-01-08 ENCOUNTER — Encounter: Payer: Medicaid Other | Admitting: Adult Health

## 2020-03-26 ENCOUNTER — Ambulatory Visit: Payer: Medicaid Other | Admitting: Adult Health

## 2020-03-31 ENCOUNTER — Other Ambulatory Visit (HOSPITAL_COMMUNITY)
Admission: RE | Admit: 2020-03-31 | Discharge: 2020-03-31 | Disposition: A | Discharge status: Home / Self Care | Payer: Medicaid Other | Source: Ambulatory Visit | Attending: Adult Health | Admitting: Adult Health

## 2020-03-31 ENCOUNTER — Other Ambulatory Visit: Payer: Self-pay

## 2020-03-31 ENCOUNTER — Ambulatory Visit (INDEPENDENT_AMBULATORY_CARE_PROVIDER_SITE_OTHER): Payer: Medicaid Other | Admitting: Adult Health

## 2020-03-31 ENCOUNTER — Encounter: Payer: Self-pay | Admitting: Adult Health

## 2020-03-31 VITALS — BP 113/66 | HR 77 | Temp 98.4°F | Resp 16 | Wt 305.8 lb

## 2020-03-31 DIAGNOSIS — B9689 Other specified bacterial agents as the cause of diseases classified elsewhere: Secondary | ICD-10-CM | POA: Diagnosis not present

## 2020-03-31 DIAGNOSIS — Z8742 Personal history of other diseases of the female genital tract: Secondary | ICD-10-CM | POA: Diagnosis not present

## 2020-03-31 DIAGNOSIS — N760 Acute vaginitis: Secondary | ICD-10-CM | POA: Diagnosis not present

## 2020-03-31 DIAGNOSIS — Z6841 Body Mass Index (BMI) 40.0 and over, adult: Secondary | ICD-10-CM

## 2020-03-31 DIAGNOSIS — Z01419 Encounter for gynecological examination (general) (routine) without abnormal findings: Secondary | ICD-10-CM

## 2020-03-31 NOTE — Progress Notes (Signed)
Complete physical exam   Patient: Jenny Weber   DOB: [birth year]   19 y.o. Female  MRN: [id number] Visit Date: 03/31/2020  Today's healthcare provider: Jairo Ben, FNP   Chief Complaint  Patient presents with   Annual Exam   Subjective     HPI   Jenny Weber is a 19 y.o. female who presents today for a complete physical exam.  She reports consuming a poor diet. The patient does not participate in regular exercise at present. She generally feels well. She reports sleeping well. She does not have additional problems to discuss today.   She has no concerns, she is always active.  Diet she says has room for improvement.  She reports she thinks she has had an abnormal Pap a few years ago and wants to have a repeat today. Provider does not have these records.   Patient's last menstrual period was 03/19/2020 (exact date).     Patient  denies any fever, body aches,chills, rash, chest pain, shortness of breath, nausea, vomiting, or diarrhea.   Denies dizziness, lightheadedness, pre syncopal or syncopal episodes.   Past Medical History:  Diagnosis Date   Allergy    Diabetes mellitus without complication (HCC)    BORDERLINE   Hypertension    History reviewed. No pertinent surgical history. Social History   Socioeconomic History   Marital status: Single    Spouse name: Not on file   Number of children: Not on file   Years of education: Not on file   Highest education level: Not on file  Occupational History   Not on file  Tobacco Use   Smoking status: Current Every Day Smoker    Types: Cigarettes   Smokeless tobacco: Never Used  Vaping Use   Vaping Use: Some days  Substance and Sexual Activity   Alcohol use: No   Drug use: No   Sexual activity: Never    Birth control/protection: None  Other Topics Concern   Not on file  Social History Narrative   Not on file   Social Determinants of Health   Financial Resource  Strain:    Difficulty of Paying Living Expenses: Not on file  Food Insecurity:    Worried About Running Out of Food in the Last Year: Not on file   [employer] of Food in the Last Year: Not on file  Transportation Needs:    Lack of Transportation (Medical): Not on file   Lack of Transportation (Non-Medical): Not on file  Physical Activity:    Days of Exercise per Week: Not on file   Minutes of Exercise per Session: Not on file  Stress:    Feeling of Stress : Not on file  Social Connections:    Frequency of Communication with Friends and Family: Not on file   Frequency of Social Gatherings with Friends and Family: Not on file   Attends Religious Services: Not on file   Active Member of Clubs or Organizations: Not on file   Attends Banker Meetings: Not on file   Marital Status: Not on file  Intimate Partner Violence:    Fear of Current or Ex-Partner: Not on file   Emotionally Abused: Not on file   Physically Abused: Not on file   Sexually Abused: Not on file   Family Status  Relation Name Status   Father  (Not Specified)   MGM  (Not Specified)   MGF  (Not Specified)   PGM  (Not  Specified)   PGF  (Not Specified)   Family History  Problem Relation Age of Onset   Hypertension Father    Hypertension Maternal Grandmother    Hypertension Maternal Grandfather    Hypertension Paternal Grandmother    Hypertension Paternal Grandfather    Allergies  Allergen Reactions   Other Swelling    mushrooms   Shellfish Allergy Itching and Swelling    Patient Care Team: Berniece PapFlinchum, Skyanne Welle S, FNP as PCP - General (Family Medicine)   Medications: Outpatient Medications Prior to Visit  Medication Sig   [DISCONTINUED] doxycycline (VIBRA-TABS) 100 MG tablet Take 1 tablet (100 mg total) by mouth 2 (two) times daily.   No facility-administered medications prior to visit.    Review of Systems  Constitutional: Negative.   HENT: Negative.     Respiratory: Negative.   Cardiovascular: Negative.   Gastrointestinal: Negative.   Genitourinary: Negative.   Musculoskeletal: Negative.   Skin: Negative.   Neurological: Negative.   Hematological: Negative.   Psychiatric/Behavioral: Negative.     Last CBC Lab Results  Component Value Date   WBC 11.1 (H) 03/15/2018   HGB 11.6 (L) 03/15/2018   HCT 36.4 03/15/2018   MCV 81.6 03/15/2018   MCH 26.0 03/15/2018   RDW 14.9 03/15/2018   PLT 279 03/15/2018   Last metabolic panel Lab Results  Component Value Date   GLUCOSE 106 (H) 03/15/2018   NA 139 03/15/2018   K 4.0 03/15/2018   CL 108 03/15/2018   CO2 23 03/15/2018   BUN 10 03/15/2018   CREATININE 0.69 03/15/2018   GFRNONAA >60 03/15/2018   GFRAA >60 03/15/2018   CALCIUM 9.4 03/15/2018   ANIONGAP 8 03/15/2018   Last lipids No results found for: CHOL, HDL, LDLCALC, LDLDIRECT, TRIG, CHOLHDL Last hemoglobin A1c No results found for: HGBA1C Last thyroid functions No results found for: TSH, T3TOTAL, T4TOTAL, THYROIDAB Last vitamin D No results found for: 25OHVITD2, 25OHVITD3, VD25OH Last vitamin B12 and Folate No results found for: VITAMINB12, FOLATE    Objective    BP 113/66    Pulse 77    Temp 98.4 F (36.9 C) (Oral)    Resp 16    Wt (!) 305 lb 12.8 oz (138.7 kg)    LMP 03/19/2020 (Exact Date)    BMI 50.89 kg/m  BP Readings from Last 3 Encounters:  03/31/20 113/66  11/08/19 105/66  03/15/18 130/85      Physical Exam Vitals reviewed. Exam conducted with a chaperone present.  Constitutional:      General: She is not in acute distress.    Appearance: She is well-developed. She is obese. She is not ill-appearing or diaphoretic.     Interventions: She is not intubated. HENT:     Head: Normocephalic and atraumatic.     Right Ear: External ear normal.     Left Ear: External ear normal.     Nose: Nose normal.     Mouth/Throat:     Pharynx: No oropharyngeal exudate.  Eyes:     General: Lids are normal. No  scleral icterus.       Right eye: No discharge.        Left eye: No discharge.     Conjunctiva/sclera: Conjunctivae normal.     Right eye: Right conjunctiva is not injected. No exudate or hemorrhage.    Left eye: Left conjunctiva is not injected. No exudate or hemorrhage.    Pupils: Pupils are equal, round, and reactive to light.  Neck:  Thyroid: No thyroid mass or thyromegaly.     Vascular: Normal carotid pulses. No carotid bruit, hepatojugular reflux or JVD.     Trachea: Trachea and phonation normal. No tracheal tenderness or tracheal deviation.     Meningeal: Brudzinski's sign and Kernig's sign absent.  Cardiovascular:     Rate and Rhythm: Normal rate and regular rhythm.     Pulses: Normal pulses.          Radial pulses are 2+ on the right side and 2+ on the left side.       Dorsalis pedis pulses are 2+ on the right side and 2+ on the left side.       Posterior tibial pulses are 2+ on the right side and 2+ on the left side.     Heart sounds: Normal heart sounds, S1 normal and S2 normal. Heart sounds not distant. No murmur heard.  No friction rub. No gallop.   Pulmonary:     Effort: Pulmonary effort is normal. No tachypnea, bradypnea, accessory muscle usage or respiratory distress. She is not intubated.     Breath sounds: Normal breath sounds. No stridor. No wheezing, rhonchi or rales.  Chest:     Chest wall: No tenderness.  Abdominal:     General: Bowel sounds are normal. There is no distension or abdominal bruit.     Palpations: Abdomen is soft. There is no shifting dullness, fluid wave, hepatomegaly, splenomegaly, mass or pulsatile mass.     Tenderness: There is no abdominal tenderness. There is no right CVA tenderness, left CVA tenderness, guarding or rebound.     Hernia: No hernia is present. There is no hernia in the left inguinal area or right inguinal area.  Genitourinary:    Exam position: Lithotomy position.     Pubic Area: No rash or pubic lice.      Tanner stage  (genital): 5.     Labia:        Right: No rash, tenderness, lesion or injury.        Left: No rash, tenderness, lesion or injury.      Urethra: No prolapse, urethral pain, urethral swelling or urethral lesion.     Vagina: Normal.     Cervix: Normal.     Uterus: Normal.      Adnexa: Right adnexa normal.     Rectum: Normal.     Comments: No DRE  Musculoskeletal:        General: No swelling, tenderness, deformity or signs of injury. Normal range of motion.     Cervical back: Full passive range of motion without pain, normal range of motion and neck supple. No edema, erythema, rigidity or tenderness. No spinous process tenderness or muscular tenderness. Normal range of motion.     Right lower leg: No edema.     Left lower leg: No edema.  Lymphadenopathy:     Head:     Right side of head: No submental, submandibular, tonsillar, preauricular, posterior auricular or occipital adenopathy.     Left side of head: No submental, submandibular, tonsillar, preauricular, posterior auricular or occipital adenopathy.     Cervical: No cervical adenopathy.     Right cervical: No superficial, deep or posterior cervical adenopathy.    Left cervical: No superficial, deep or posterior cervical adenopathy.     Upper Body:     Right upper body: No supraclavicular or pectoral adenopathy.     Left upper body: No supraclavicular or pectoral adenopathy.     Lower  Body: No right inguinal adenopathy. No left inguinal adenopathy.  Skin:    General: Skin is warm and dry.     Coloration: Skin is not jaundiced or pale.     Findings: No abrasion, bruising, burn, ecchymosis, erythema, lesion, petechiae or rash.     Nails: There is no clubbing.  Neurological:     General: No focal deficit present.     Mental Status: She is alert and oriented to person, place, and time.     GCS: GCS eye subscore is 4. GCS verbal subscore is 5. GCS motor subscore is 6.     Cranial Nerves: No cranial nerve deficit.     Sensory: No  sensory deficit.     Motor: No weakness, tremor, atrophy, abnormal muscle tone or seizure activity.     Coordination: Coordination normal.     Gait: Gait normal.     Deep Tendon Reflexes: Reflexes are normal and symmetric. Reflexes normal. Babinski sign absent on the right side. Babinski sign absent on the left side.     Reflex Scores:      Tricep reflexes are 2+ on the right side and 2+ on the left side.      Bicep reflexes are 2+ on the right side and 2+ on the left side.      Brachioradialis reflexes are 2+ on the right side and 2+ on the left side.      Patellar reflexes are 2+ on the right side and 2+ on the left side.      Achilles reflexes are 2+ on the right side and 2+ on the left side. Psychiatric:        Speech: Speech normal.        Behavior: Behavior normal.        Thought Content: Thought content normal.        Judgment: Judgment normal.       Last depression screening scores PHQ 2/9 Scores 11/08/2019  PHQ - 2 Score 2  PHQ- 9 Score 15   Last fall risk screening Fall Risk  11/17/2014  Falls in the past year? No   Last Audit-C alcohol use screening Alcohol Use Disorder Test (AUDIT) 11/08/2019  1. How often do you have a drink containing alcohol? 1  2. How many drinks containing alcohol do you have on a typical day when you are drinking? 1  3. How often do you have six or more drinks on one occasion? 1  AUDIT-C Score 3   A score of 3 or more in women, and 4 or more in men indicates increased risk for alcohol abuse, EXCEPT if all of the points are from question 1   Results for orders placed or performed in visit on 03/31/20  Cytology - PAP  Result Value Ref Range   Neisseria Gonorrhea Negative    Chlamydia Negative    Trichomonas Negative    Adequacy      Satisfactory for evaluation; transformation zone component PRESENT.   Diagnosis      - Negative for intraepithelial lesion or malignancy (NILM)   Microorganisms      Fungal organisms present consistent with  Candida spp.   Comment Normal Reference Range Trichomonas - Negative    Comment Normal Reference Ranger Chlamydia - Negative    Comment      Normal Reference Range Neisseria Gonorrhea - Negative    Assessment & Plan    Routine Health Maintenance and Physical Exam  Exercise Activities and Dietary recommendations Goals  None     Immunization History  Administered Date(s) Administered   HPV 9-valent 09/23/2014, 09/28/2015   Meningococcal Conjugate 09/28/2015    Health Maintenance  Topic Date Due   COVID-19 Vaccine (1) Never done   HIV Screening  Never done   INFLUENZA VACCINE  Never done   TETANUS/TDAP  04/03/2021 (Originally 09/09/2018)   Hepatitis C Screening  04/03/2021 (Originally June 01, 2000)   No orders of the defined types were placed in this encounter.  Discussed health benefits of physical activity, and encouraged her to engage in regular exercise appropriate for her age and condition.   The patient is advised to begin progressive daily aerobic exercise program, follow a low fat, low cholesterol diet, attempt to lose weight, reduce salt in diet and cooking, reduce exposure to stress, improve dietary compliance, continue current medications, continue current healthy lifestyle patterns and return for routine annual checkups. Return in about 1 year (around 03/31/2021), or if symptoms worsen or fail to improve, for at any time for any worsening symptoms, Go to Emergency room/ urgent care if worse.    Red Flags discussed. The patient was given clear instructions to go to ER or return to medical center if any red flags develop, symptoms do not improve, worsen or new problems develop. They verbalized understanding.   An After Visit Summary was printed and given to the patient.   Jairo Ben, FNP  [facility] [phone] (phone) [phone] (fax)  [facility]

## 2020-03-31 NOTE — Patient Instructions (Addendum)
Pap Test Why am I having this test? A Pap test, also called a Pap smear, is a screening test to check for signs of:  Cancer of the vagina, cervix, and uterus. The cervix is the lower part of the uterus that opens into the vagina.  Infection.  Changes that may be a sign that cancer is developing (precancerous changes). Women need this test on a regular basis. In general, you should have a Pap test every 3 years until you reach menopause or age 20. Women aged 30-60 may choose to have their Pap test done at the same time as an HPV (human papillomavirus) test every 5 years (instead of every 3 years). Your health care provider may recommend having Pap tests more or less often depending on your medical conditions and past Pap test results. What kind of sample is taken?  Your health care provider will collect a sample of cells from the surface of your cervix. This will be done using a small cotton swab, plastic spatula, or brush. This sample is often collected during a pelvic exam, when you are lying on your back on an exam table with feet in footrests (stirrups). In some cases, fluids (secretions) from the cervix or vagina may also be collected. How do I prepare for this test?  Be aware of where you are in your menstrual cycle. If you are menstruating on the day of the test, you may be asked to reschedule.  You may need to reschedule if you have a known vaginal infection on the day of the test.  Follow instructions from your health care provider about: ? Changing or stopping your regular medicines. Some medicines can cause abnormal test results, such as digitalis and tetracycline. ? Avoiding douching or taking a bath the day before or the day of the test. Tell a health care provider about:  Any allergies you have.  All medicines you are taking, including vitamins, herbs, eye drops, creams, and over-the-counter medicines.  Any blood disorders you have.  Any surgeries you have had.  Any  medical conditions you have.  Whether you are pregnant or may be pregnant. How are the results reported? Your test results will be reported as either abnormal or normal. A false-positive result can occur. A false positive is incorrect because it means that a condition is present when it is not. A false-negative result can occur. A false negative is incorrect because it means that a condition is not present when it is. What do the results mean? A normal test result means that you do not have signs of cancer of the vagina, cervix, or uterus. An abnormal result may mean that you have:  Cancer. A Pap test by itself is not enough to diagnose cancer. You will have more tests done in this case.  Precancerous changes in your vagina, cervix, or uterus.  Inflammation of the cervix.  An STD (sexually transmitted disease).  A fungal infection.  A parasite infection. Talk with your health care provider about what your results mean. Questions to ask your health care provider Ask your health care provider, or the department that is doing the test:  When will my results be ready?  How will I get my results?  What are my treatment options?  What other tests do I need?  What are my next steps? Summary  In general, women should have a Pap test every 3 years until they reach menopause or age 20.  Your health care provider will collect a   sample of cells from the surface of your cervix. This will be done using a small cotton swab, plastic spatula, or brush.  In some cases, fluids (secretions) from the cervix or vagina may also be collected. This information is not intended to replace advice given to you by your health care provider. Make sure you discuss any questions you have with your health care provider. Document Revised: 01/30/2017 Document Reviewed: 01/30/2017 Elsevier Patient Education  2020 [place] Maintenance, Female Adopting a healthy lifestyle and getting preventive  care are important in promoting health and wellness. Ask your health care provider about:  The right schedule for you to have regular tests and exams.  Things you can do on your own to prevent diseases and keep yourself healthy. What should I know about diet, weight, and exercise? Eat a healthy diet   Eat a diet that includes plenty of vegetables, fruits, low-fat dairy products, and lean protein.  Do not eat a lot of foods that are high in solid fats, added sugars, or sodium. Maintain a healthy weight Body mass index (BMI) is used to identify weight problems. It estimates body fat based on height and weight. Your health care provider can help determine your BMI and help you achieve or maintain a healthy weight. Get regular exercise Get regular exercise. This is one of the most important things you can do for your health. Most adults should:  Exercise for at least 150 minutes each week. The exercise should increase your heart rate and make you sweat (moderate-intensity exercise).  Do strengthening exercises at least twice a week. This is in addition to the moderate-intensity exercise.  Spend less time sitting. Even light physical activity can be beneficial. Watch cholesterol and blood lipids Have your blood tested for lipids and cholesterol at 20 years of age, then have this test every 5 years. Have your cholesterol levels checked more often if:  Your lipid or cholesterol levels are high.  You are older than 20 years of age.  You are at high risk for heart disease. What should I know about cancer screening? Depending on your health history and family history, you may need to have cancer screening at various ages. This may include screening for:  Breast cancer.  Cervical cancer.  Colorectal cancer.  Skin cancer.  Lung cancer. What should I know about heart disease, diabetes, and high blood pressure? Blood pressure and heart disease  High blood pressure causes heart disease  and increases the risk of stroke. This is more likely to develop in people who have high blood pressure readings, are of African descent, or are overweight.  Have your blood pressure checked: ? Every 3-5 years if you are 16-88 years of age. ? Every year if you are 18 years old or older. Diabetes Have regular diabetes screenings. This checks your fasting blood sugar level. Have the screening done:  Once every three years after age 68 if you are at a normal weight and have a low risk for diabetes.  More often and at a younger age if you are overweight or have a high risk for diabetes. What should I know about preventing infection? Hepatitis B If you have a higher risk for hepatitis B, you should be screened for this virus. Talk with your health care provider to find out if you are at risk for hepatitis B infection. Hepatitis C Testing is recommended for:  Everyone born from [birth year] through 1966.  Anyone with known risk factors for hepatitis C.  Sexually transmitted infections (STIs)  Get screened for STIs, including gonorrhea and chlamydia, if: ? You are sexually active and are younger than 20 years of age. ? You are older than 20 years of age and your health care provider tells you that you are at risk for this type of infection. ? Your sexual activity has changed since you were last screened, and you are at increased risk for chlamydia or gonorrhea. Ask your health care provider if you are at risk.  Ask your health care provider about whether you are at high risk for HIV. Your health care provider may recommend a prescription medicine to help prevent HIV infection. If you choose to take medicine to prevent HIV, you should first get tested for HIV. You should then be tested every 3 months for as long as you are taking the medicine. Pregnancy  If you are about to stop having your period (premenopausal) and you may become pregnant, seek counseling before you get pregnant.  Take 400 to 800  micrograms (mcg) of folic acid every day if you become pregnant.  Ask for birth control (contraception) if you want to prevent pregnancy. Osteoporosis and menopause Osteoporosis is a disease in which the bones lose minerals and strength with aging. This can result in bone fractures. If you are 101 years old or older, or if you are at risk for osteoporosis and fractures, ask your health care provider if you should:  Be screened for bone loss.  Take a calcium or vitamin D supplement to lower your risk of fractures.  Be given hormone replacement therapy (HRT) to treat symptoms of menopause. Follow these instructions at home: Lifestyle  Do not use any products that contain nicotine or tobacco, such as cigarettes, e-cigarettes, and chewing tobacco. If you need help quitting, ask your health care provider.  Do not use street drugs.  Do not share needles.  Ask your health care provider for help if you need support or information about quitting drugs. Alcohol use  Do not drink alcohol if: ? Your health care provider tells you not to drink. ? You are pregnant, may be pregnant, or are planning to become pregnant.  If you drink alcohol: ? Limit how much you use to 0-1 drink a day. ? Limit intake if you are breastfeeding.  Be aware of how much alcohol is in your drink. In the [place] one drink equals one 12 oz bottle of beer (355 mL), one 5 oz glass of wine (148 mL), or one 1 oz glass of hard liquor (44 mL). General instructions  Schedule regular health, dental, and eye exams.  Stay current with your vaccines.  Tell your health care provider if: ? You often feel depressed. ? You have ever been abused or do not feel safe at home. Summary  Adopting a healthy lifestyle and getting preventive care are important in promoting health and wellness.  Follow your health care provider's instructions about healthy diet, exercising, and getting tested or screened for diseases.  Follow your health  care provider's instructions on monitoring your cholesterol and blood pressure. This information is not intended to replace advice given to you by your health care provider. Make sure you discuss any questions you have with your health care provider. Document Revised: 05/16/2018 Document Reviewed: 05/16/2018 Elsevier Patient Education  2020 [employer] Inc[employer]   Breast Self-Awareness Breast self-awareness is knowing how your breasts look and feel. Doing breast self-awareness is important. It allows you to catch a breast problem early while  it is still small and can be treated. All women should do breast self-awareness, including women who have had breast implants. Tell your doctor if you notice a change in your breasts. What you need:  A mirror.  A well-lit room. How to do a breast self-exam A breast self-exam is one way to learn what is normal for your breasts and to check for changes. To do a breast self-exam: Look for changes  1. Take off all the clothes above your waist. 2. Stand in front of a mirror in a room with good lighting. 3. Put your hands on your hips. 4. Push your hands down. 5. Look at your breasts and nipples in the mirror to see if one breast or nipple looks different from the other. Check to see if: ? The shape of one breast is different. ? The size of one breast is different. ? There are wrinkles, dips, and bumps in one breast and not the other. 6. Look at each breast for changes in the skin, such as: ? Redness. ? Scaly areas. 7. Look for changes in your nipples, such as: ? Liquid around the nipples. ? Bleeding. ? Dimpling. ? Redness. ? A change in where the nipples are. Feel for changes  1. Lie on your back on the floor. 2. Feel each breast. To do this, follow these steps: ? Pick a breast to feel. ? Put the arm closest to that breast above your head. ? Use your other arm to feel the nipple area of your breast. Feel the area with the pads of your three middle  fingers by making small circles with your fingers. For the first circle, press lightly. For the second circle, press harder. For the third circle, press even harder. ? Keep making circles with your fingers at the different pressures as you move down your breast. Stop when you feel your ribs. ? Move your fingers a little toward the center of your body. ? Start making circles with your fingers again, this time going up until you reach your collarbone. ? Keep making up-and-down circles until you reach your armpit. Remember to keep using the three pressures. ? Feel the other breast in the same way. 3. Sit or stand in the tub or shower. 4. With soapy water on your skin, feel each breast the same way you did in step 2 when you were lying on the floor. Write down what you find Writing down what you find can help you remember what to tell your doctor. Write down:  What is normal for each breast.  Any changes you find in each breast, including: ? The kind of changes you find. ? Whether you have pain. ? Size and location of any lumps.  When you last had your menstrual period. General tips  Check your breasts every month.  If you are breastfeeding, the best time to check your breasts is after you feed your baby or after you use a breast pump.  If you get menstrual periods, the best time to check your breasts is 5-7 days after your menstrual period is over.  With time, you will become comfortable with the self-exam, and you will begin to know if there are changes in your breasts. Contact a doctor if you:  See a change in the shape or size of your breasts or nipples.  See a change in the skin of your breast or nipples, such as red or scaly skin.  Have fluid coming from your nipples that  is not normal.  Find a lump or thick area that was not there before.  Have pain in your breasts.  Have any concerns about your breast health. Summary  Breast self-awareness includes looking for changes  in your breasts, as well as feeling for changes within your breasts.  Breast self-awareness should be done in front of a mirror in a well-lit room.  You should check your breasts every month. If you get menstrual periods, the best time to check your breasts is 5-7 days after your menstrual period is over.  Let your doctor know of any changes you see in your breasts, including changes in size, changes on the skin, pain or tenderness, or fluid from your nipples that is not normal. This information is not intended to replace advice given to you by your health care provider. Make sure you discuss any questions you have with your health care provider. Document Revised: 01/09/2018 Document Reviewed: 01/09/2018 Elsevier Patient Education  2020 [employer]   Calorie Counting for [employer] Calories are units of energy. Your body needs a certain amount of calories from food to keep you going throughout the day. When you eat more calories than your body needs, your body stores the extra calories as fat. When you eat fewer calories than your body needs, your body burns fat to get the energy it needs. Calorie counting means keeping track of how many calories you eat and drink each day. Calorie counting can be helpful if you need to lose weight. If you make sure to eat fewer calories than your body needs, you should lose weight. Ask your health care provider what a healthy weight is for you. For calorie counting to work, you will need to eat the right number of calories in a day in order to lose a healthy amount of weight per week. A dietitian can help you determine how many calories you need in a day and will give you suggestions on how to reach your calorie goal.  A healthy amount of weight to lose per week is usually 1-2 lb (0.5-0.9 kg). This usually means that your daily calorie intake should be reduced by 500-750 calories.  Eating 1,200 - 1,500 calories per day can help most women lose  weight.  Eating 1,500 - 1,800 calories per day can help most men lose weight. What is my plan? My goal is to have __________ calories per day. If I have this many calories per day, I should lose around __________ pounds per week. What do I need to know about calorie counting? In order to meet your daily calorie goal, you will need to:  Find out how many calories are in each food you would like to eat. Try to do this before you eat.  Decide how much of the food you plan to eat.  Write down what you ate and how many calories it had. Doing this is called keeping a food log. To successfully lose weight, it is important to balance calorie counting with a healthy lifestyle that includes regular activity. Aim for 150 minutes of moderate exercise (such as walking) or 75 minutes of vigorous exercise (such as running) each week. Where do I find calorie information?  The number of calories in a food can be found on a Nutrition Facts label. If a food does not have a Nutrition Facts label, try to look up the calories online or ask your dietitian for help. Remember that calories are listed per serving. If you choose  to have more than one serving of a food, you will have to multiply the calories per serving by the amount of servings you plan to eat. For example, the label on a package of bread might say that a serving size is 1 slice and that there are 90 calories in a serving. If you eat 1 slice, you will have eaten 90 calories. If you eat 2 slices, you will have eaten 180 calories. How do I keep a food log? Immediately after each meal, record the following information in your food log:  What you ate. Don't forget to include toppings, sauces, and other extras on the food.  How much you ate. This can be measured in cups, ounces, or number of items.  How many calories each food and drink had.  The total number of calories in the meal. Keep your food log near you, such as in a small notebook in your  pocket, or use a mobile app or website. Some programs will calculate calories for you and show you how many calories you have left for the day to meet your goal. What are some calorie counting tips?   Use your calories on foods and drinks that will fill you up and not leave you hungry: ? Some examples of foods that fill you up are nuts and nut butters, vegetables, lean proteins, and high-fiber foods like whole grains. High-fiber foods are foods with more than 5 g fiber per serving. ? Drinks such as sodas, specialty coffee drinks, alcohol, and juices have a lot of calories, yet do not fill you up.  Eat nutritious foods and avoid empty calories. Empty calories are calories you get from foods or beverages that do not have many vitamins or protein, such as candy, sweets, and soda. It is better to have a nutritious high-calorie food (such as an avocado) than a food with few nutrients (such as a bag of chips).  Know how many calories are in the foods you eat most often. This will help you calculate calorie counts faster.  Pay attention to calories in drinks. Low-calorie drinks include water and unsweetened drinks.  Pay attention to nutrition labels for "low fat" or "fat free" foods. These foods sometimes have the same amount of calories or more calories than the full fat versions. They also often have added sugar, starch, or salt, to make up for flavor that was removed with the fat.  Find a way of tracking calories that works for you. Get creative. Try different apps or programs if writing down calories does not work for you. What are some portion control tips?  Know how many calories are in a serving. This will help you know how many servings of a certain food you can have.  Use a measuring cup to measure serving sizes. You could also try weighing out portions on a kitchen scale. With time, you will be able to estimate serving sizes for some foods.  Take some time to put servings of different foods  on your favorite plates, bowls, and cups so you know what a serving looks like.  Try not to eat straight from a bag or box. Doing this can lead to overeating. Put the amount you would like to eat in a cup or on a plate to make sure you are eating the right portion.  Use smaller plates, glasses, and bowls to prevent overeating.  Try not to multitask (for example, watch TV or use your computer) while eating. If it is  time to eat, sit down at a table and enjoy your food. This will help you to know when you are full. It will also help you to be aware of what you are eating and how much you are eating. What are tips for following this plan? Reading food labels  Check the calorie count compared to the serving size. The serving size may be smaller than what you are used to eating.  Check the source of the calories. Make sure the food you are eating is high in vitamins and protein and low in saturated and trans fats. Shopping  Read nutrition labels while you shop. This will help you make healthy decisions before you decide to purchase your food.  Make a grocery list and stick to it. Cooking  Try to cook your favorite foods in a healthier way. For example, try baking instead of frying.  Use low-fat dairy products. Meal planning  Use more fruits and vegetables. Half of your plate should be fruits and vegetables.  Include lean proteins like poultry and fish. How do I count calories when eating out?  Ask for smaller portion sizes.  Consider sharing an entree and sides instead of getting your own entree.  If you get your own entree, eat only half. Ask for a box at the beginning of your meal and put the rest of your entree in it so you are not tempted to eat it.  If calories are listed on the menu, choose the lower calorie options.  Choose dishes that include vegetables, fruits, whole grains, low-fat dairy products, and lean protein.  Choose items that are boiled, broiled, grilled, or  steamed. Stay away from items that are buttered, battered, fried, or served with cream sauce. Items labeled "crispy" are usually fried, unless stated otherwise.  Choose water, low-fat milk, unsweetened iced tea, or other drinks without added sugar. If you want an alcoholic beverage, choose a lower calorie option such as a glass of wine or light beer.  Ask for dressings, sauces, and syrups on the side. These are usually high in calories, so you should limit the amount you eat.  If you want a salad, choose a garden salad and ask for grilled meats. Avoid extra toppings like bacon, cheese, or fried items. Ask for the dressing on the side, or ask for olive oil and vinegar or lemon to use as dressing.  Estimate how many servings of a food you are given. For example, a serving of cooked rice is  cup or about the size of half a baseball. Knowing serving sizes will help you be aware of how much food you are eating at restaurants. The list below tells you how big or small some common portion sizes are based on everyday objects: ? 1 oz--4 stacked dice. ? 3 oz--1 deck of cards. ? 1 tsp--1 die. ? 1 Tbsp-- a ping-pong ball. ? 2 Tbsp--1 ping-pong ball. ?  cup-- baseball. ? 1 cup--1 baseball. Summary  Calorie counting means keeping track of how many calories you eat and drink each day. If you eat fewer calories than your body needs, you should lose weight.  A healthy amount of weight to lose per week is usually 1-2 lb (0.5-0.9 kg). This usually means reducing your daily calorie intake by 500-750 calories.  The number of calories in a food can be found on a Nutrition Facts label. If a food does not have a Nutrition Facts label, try to look up the calories online or ask your  dietitian for help.  Use your calories on foods and drinks that will fill you up, and not on foods and drinks that will leave you hungry.  Use smaller plates, glasses, and bowls to prevent overeating. This information is not  intended to replace advice given to you by your health care provider. Make sure you discuss any questions you have with your health care provider. Document Revised: 02/09/2018 Document Reviewed: 04/22/2016 Elsevier Patient Education  2020 [employer]   Fat and Cholesterol Restricted Eating Plan Getting too much fat and cholesterol in your diet may cause health problems. Choosing the right foods helps keep your fat and cholesterol at normal levels. This can keep you from getting certain diseases. Your doctor may recommend an eating plan that includes:  Total fat: ______% or less of total calories a day.  Saturated fat: ______% or less of total calories a day.  Cholesterol: less than _________mg a day.  Fiber: ______g a day. What are tips for following this plan? Meal planning  At meals, divide your plate into four equal parts: ? Fill one-half of your plate with vegetables and green salads. ? Fill one-fourth of your plate with whole grains. ? Fill one-fourth of your plate with low-fat (lean) protein foods.  Eat fish that is high in omega-3 fats at least two times a week. This includes mackerel, tuna, sardines, and salmon.  Eat foods that are high in fiber, such as whole grains, beans, apples, broccoli, carrots, peas, and barley. General tips   Work with your doctor to lose weight if you need to.  Avoid: ? Foods with added sugar. ? Fried foods. ? Foods with partially hydrogenated oils.  Limit alcohol intake to no more than 1 drink a day for nonpregnant women and 2 drinks a day for men. One drink equals 12 oz of beer, 5 oz of wine, or 1 oz of hard liquor. Reading food labels  Check food labels for: ? Trans fats. ? Partially hydrogenated oils. ? Saturated fat (g) in each serving. ? Cholesterol (mg) in each serving. ? Fiber (g) in each serving.  Choose foods with healthy fats, such as: ? Monounsaturated fats. ? Polyunsaturated fats. ? Omega-3 fats.  Choose grain  products that have whole grains. Look for the word "whole" as the first word in the ingredient list. Cooking  Cook foods using low-fat methods. These include baking, boiling, grilling, and broiling.  Eat more home-cooked foods. Eat at restaurants and buffets less often.  Avoid cooking using saturated fats, such as butter, cream, palm oil, palm kernel oil, and coconut oil. Recommended foods  Fruits  All fresh, canned (in natural juice), or frozen fruits. Vegetables  Fresh or frozen vegetables (raw, steamed, roasted, or grilled). Green salads. Grains  Whole grains, such as whole wheat or whole grain breads, crackers, cereals, and pasta. Unsweetened oatmeal, bulgur, barley, quinoa, or brown rice. Corn or whole wheat flour tortillas. Meats and other protein foods  Ground beef (85% or leaner), grass-fed beef, or beef trimmed of fat. Skinless chicken or [place]. Ground chicken or [place]. Pork trimmed of fat. All fish and seafood. Egg whites. Dried beans, peas, or lentils. Unsalted nuts or seeds. Unsalted canned beans. Nut butters without added sugar or oil. Dairy  Low-fat or nonfat dairy products, such as skim or 1% milk, 2% or reduced-fat cheeses, low-fat and fat-free ricotta or cottage cheese, or plain low-fat and nonfat yogurt. Fats and oils  Tub margarine without trans fats. Light or reduced-fat mayonnaise and salad  dressings. Avocado. Olive, canola, sesame, or safflower oils. The items listed above may not be a complete list of foods and beverages you can eat. Contact a dietitian for more information. Foods to avoid Fruits  Canned fruit in heavy syrup. Fruit in cream or butter sauce. Fried fruit. Vegetables  Vegetables cooked in cheese, cream, or butter sauce. Fried vegetables. Grains  White bread. White pasta. White rice. Cornbread. Bagels, pastries, and croissants. Crackers and snack foods that contain trans fat and hydrogenated oils. Meats and other protein foods  Fatty cuts  of meat. Ribs, chicken wings, bacon, sausage, bologna, salami, chitterlings, fatback, hot dogs, bratwurst, and packaged lunch meats. Liver and organ meats. Whole eggs and egg yolks. Chicken and [place] with skin. Fried meat. Dairy  Whole or 2% milk, cream, half-and-half, and cream cheese. Whole milk cheeses. Whole-fat or sweetened yogurt. Full-fat cheeses. Nondairy creamers and whipped toppings. Processed cheese, cheese spreads, and cheese curds. Beverages  Alcohol. Sugar-sweetened drinks such as sodas, lemonade, and fruit drinks. Fats and oils  Butter, stick margarine, lard, shortening, ghee, or bacon fat. Coconut, palm kernel, and palm oils. Sweets and desserts  Corn syrup, sugars, honey, and molasses. Candy. Jam and jelly. Syrup. Sweetened cereals. Cookies, pies, cakes, donuts, muffins, and ice cream. The items listed above may not be a complete list of foods and beverages you should avoid. Contact a dietitian for more information. Summary  Choosing the right foods helps keep your fat and cholesterol at normal levels. This can keep you from getting certain diseases.  At meals, fill one-half of your plate with vegetables and green salads.  Eat high-fiber foods, like whole grains, beans, apples, carrots, peas, and barley.  Limit added sugar, saturated fats, alcohol, and fried foods. This information is not intended to replace advice given to you by your health care provider. Make sure you discuss any questions you have with your health care provider. Document Revised: 01/24/2018 Document Reviewed: 02/07/2017 Elsevier Patient Education  2020 [employer].

## 2020-04-01 ENCOUNTER — Encounter: Payer: Self-pay | Admitting: Adult Health

## 2020-04-01 ENCOUNTER — Other Ambulatory Visit: Payer: Self-pay | Admitting: Adult Health

## 2020-04-01 DIAGNOSIS — N760 Acute vaginitis: Secondary | ICD-10-CM

## 2020-04-01 LAB — CYTOLOGY - PAP
Chlamydia: NEGATIVE
Comment: NEGATIVE
Comment: NEGATIVE
Comment: NORMAL
Diagnosis: NEGATIVE
Neisseria Gonorrhea: NEGATIVE
Trichomonas: NEGATIVE

## 2020-04-01 MED ORDER — TERCONAZOLE 80 MG VA SUPP: 80.0000 mg | Freq: Every day | VAGINAL | 0 refills | Status: AC

## 2020-04-01 NOTE — Progress Notes (Signed)
Meds ordered this encounter  Medications  . terconazole (TERAZOL 3) 80 MG vaginal suppository    Sig: Place 1 suppository (80 mg total) vaginally at bedtime for 3 days.    Dispense:  3 suppository    Refill:  0    Acute vaginitis- yeast per pap smear 03/2020

## 2020-04-01 NOTE — Progress Notes (Signed)
Yeast infection seen on cytology otherwise negative testing and negative for malignancy. Repeat PAP in 3 years advised.  Also will send in  prescription Terazol vaginal cream for vaginal yeast infection use as directed.  Follow up as needed and per office visit.

## 2020-04-03 DIAGNOSIS — Z8742 Personal history of other diseases of the female genital tract: Secondary | ICD-10-CM | POA: Insufficient documentation

## 2020-11-17 ENCOUNTER — Encounter: Payer: Self-pay | Admitting: Adult Health

## 2020-11-17 ENCOUNTER — Other Ambulatory Visit: Payer: Self-pay

## 2020-11-17 ENCOUNTER — Ambulatory Visit (INDEPENDENT_AMBULATORY_CARE_PROVIDER_SITE_OTHER): Payer: Medicaid Other | Admitting: Adult Health

## 2020-11-17 VITALS — BP 116/72 | HR 78 | Temp 98.3°F | Ht 65.0 in | Wt 311.4 lb

## 2020-11-17 DIAGNOSIS — E559 Vitamin D deficiency, unspecified: Secondary | ICD-10-CM | POA: Diagnosis not present

## 2020-11-17 DIAGNOSIS — Z6841 Body Mass Index (BMI) 40.0 and over, adult: Secondary | ICD-10-CM

## 2020-11-17 DIAGNOSIS — Z113 Encounter for screening for infections with a predominantly sexual mode of transmission: Secondary | ICD-10-CM | POA: Diagnosis not present

## 2020-11-17 DIAGNOSIS — E538 Deficiency of other specified B group vitamins: Secondary | ICD-10-CM | POA: Diagnosis not present

## 2020-11-17 DIAGNOSIS — M62838 Other muscle spasm: Secondary | ICD-10-CM | POA: Diagnosis not present

## 2020-11-17 DIAGNOSIS — B373 Candidiasis of vulva and vagina: Secondary | ICD-10-CM | POA: Diagnosis not present

## 2020-11-17 DIAGNOSIS — Z1389 Encounter for screening for other disorder: Secondary | ICD-10-CM

## 2020-11-17 DIAGNOSIS — D649 Anemia, unspecified: Secondary | ICD-10-CM | POA: Diagnosis not present

## 2020-11-17 DIAGNOSIS — R5383 Other fatigue: Secondary | ICD-10-CM

## 2020-11-17 DIAGNOSIS — F419 Anxiety disorder, unspecified: Secondary | ICD-10-CM | POA: Diagnosis not present

## 2020-11-17 DIAGNOSIS — B3731 Acute candidiasis of vulva and vagina: Secondary | ICD-10-CM

## 2020-11-17 LAB — COMPREHENSIVE METABOLIC PANEL
ALT: 23 U/L (ref 0–35)
AST: 18 U/L (ref 0–37)
Albumin: 3.7 g/dL (ref 3.5–5.2)
Alkaline Phosphatase: 78 U/L (ref 39–117)
BUN: 7 mg/dL (ref 6–23)
CO2: 26 mEq/L (ref 19–32)
Calcium: 9 mg/dL (ref 8.4–10.5)
Chloride: 106 mEq/L (ref 96–112)
Creatinine, Ser: 0.63 mg/dL (ref 0.40–1.20)
GFR: 127.01 mL/min (ref >60.00)
Glucose, Bld: 80 mg/dL (ref 70–99)
Potassium: 4.1 mEq/L (ref 3.5–5.1)
Sodium: 138 mEq/L (ref 135–145)
Total Bilirubin: 0.4 mg/dL (ref 0.2–1.2)
Total Protein: 6.8 g/dL (ref 6.0–8.3)

## 2020-11-17 LAB — URINALYSIS, MICROSCOPIC ONLY

## 2020-11-17 LAB — CBC WITH DIFFERENTIAL/PLATELET
Basophils Absolute: 0 10*3/uL (ref 0.0–0.1)
Basophils Relative: 0.2 % (ref 0.0–3.0)
Eosinophils Absolute: 0.4 10*3/uL (ref 0.0–0.7)
Eosinophils Relative: 4.9 % (ref 0.0–5.0)
HCT: 35.2 % — ABNORMAL LOW (ref 36.0–46.0)
Hemoglobin: 11.5 g/dL — ABNORMAL LOW (ref 12.0–15.0)
Lymphocytes Relative: 22.7 % (ref 12.0–46.0)
Lymphs Abs: 1.6 10*3/uL (ref 0.7–4.0)
MCHC: 32.6 g/dL (ref 30.0–36.0)
MCV: 83.4 fl (ref 78.0–100.0)
Monocytes Absolute: 0.6 10*3/uL (ref 0.1–1.0)
Monocytes Relative: 9 % (ref 3.0–12.0)
Neutro Abs: 4.5 10*3/uL (ref 1.4–7.7)
Neutrophils Relative %: 63.2 % (ref 43.0–77.0)
Platelets: 213 10*3/uL (ref 150.0–400.0)
RBC: 4.22 Mil/uL (ref 3.87–5.11)
RDW: 14.9 % (ref 11.5–15.5)
WBC: 7.1 10*3/uL (ref 4.0–10.5)

## 2020-11-17 LAB — TSH: TSH: 2.64 u[IU]/mL (ref 0.35–4.50)

## 2020-11-17 LAB — B12 AND FOLATE PANEL
Folate: 7.2 ng/mL (ref >5.9)
Vitamin B-12: 287 pg/mL (ref 211–911)

## 2020-11-17 LAB — LIPID PANEL
Cholesterol: 140 mg/dL (ref 0–200)
HDL: 46.4 mg/dL (ref >39.00)
LDL Cholesterol: 84 mg/dL (ref 0–99)
NonHDL: 93.93
Total CHOL/HDL Ratio: 3
Triglycerides: 50 mg/dL (ref 0.0–149.0)
VLDL: 10 mg/dL (ref 0.0–40.0)

## 2020-11-17 LAB — VITAMIN D 25 HYDROXY (VIT D DEFICIENCY, FRACTURES): VITD: 13.37 ng/mL — ABNORMAL LOW (ref 30.00–100.00)

## 2020-11-17 MED ORDER — BACLOFEN 10 MG PO TABS
10.0000 mg | ORAL_TABLET | Freq: Every evening | ORAL | 0 refills | Status: DC | PRN
Start: 2020-11-17 — End: 2021-10-18

## 2020-11-17 MED ORDER — TERCONAZOLE 0.4 % VA CREA: 1.0000 | TOPICAL_CREAM | Freq: Every day | VAGINAL | 0 refills | Status: AC

## 2020-11-17 MED ORDER — ESCITALOPRAM OXALATE 10 MG PO TABS: 10.0000 mg | ORAL_TABLET | Freq: Every day | ORAL | 0 refills | Status: DC

## 2020-11-17 NOTE — Patient Instructions (Addendum)
Terconazole vaginal cream What is this medication? TERCONAZOLE (ter KON a zole) is an antifungal medicine. It is used to treatyeast infections of the vagina. This medicine may be used for other purposes; ask your health care provider orpharmacist if you have questions. COMMON BRAND NAME(S): Terazol 3, Terazol 7, Zazole What should I tell my care team before I take this medication? They need to know if you have any of these conditions: an unusual or allergic reaction to terconazole, other antifungals, other medicines, foods, dyes or preservatives pregnant or trying to get pregnant breast-feeding How should I use this medication? This medicine is only for use in the vagina. Do not take by mouth. Wash hands before and after use. Read package directions carefully before using. Use this medicine at bedtime, unless otherwise directed by your doctor or health care professional. Screw the applicator onto the end of the tube and squeeze the tube to fill the applicator. Remove the applicator from the tube. Lie on your back. Gently insert the applicator tip high in the vagina and push the plunger to release the cream into the vagina. Gently remove the applicator. Wash the applicator well with warm water and soap. Use at regular intervals. Do not get this medicine in your eyes. If you do, rinse out with plenty of cool tap water. Finish the full course prescribed by your doctor or health care professional even if you think your condition is better. Do not stop using this medicine ifyour menstrual period starts during the time of treatment. Talk to your pediatrician regarding the use of this medicine in children.Special care may be needed. Overdosage: If you think you have taken too much of this medicine contact apoison control center or emergency room at once. NOTE: This medicine is only for you. Do not share this medicine with others. What if I miss a dose? If you miss a dose, use it as soon as you can. If  it is almost time for yournext dose, use only that dose. Do not use double or extra doses. What may interact with this medication? Interactions are not expected. Do not use any other vaginal products withouttelling your doctor or health care professional. This list may not describe all possible interactions. Give your health care provider a list of all the medicines, herbs, non-prescription drugs, or dietary supplements you use. Also tell them if you smoke, drink alcohol, or use illegaldrugs. Some items may interact with your medicine. What should I watch for while using this medication? Tell your doctor or health care professional if your symptoms do not start toget better within a few days. It is better not to have sex until you have finished your treatment. If you have sex, your partner should use a condom during sex to help prevent transferof the infection. Your sexual partner may also need treatment. Vaginal medicines usually will come out of the vagina during treatment. To keep the medicine from getting on your clothing, wear a mini-pad or sanitary napkin. The use of tampons is not recommended since they may soak up the medicine. To help clear up the infection, wear freshly washed cotton, not synthetic,underwear. What side effects may I notice from receiving this medication? Side effects that you should report to your doctor or health care professionalas soon as possible: painful or difficult urination vaginal pain Side effects that usually do not require medical attention (report to yourdoctor or health care professional if they continue or are bothersome): headache menstrual pain stomach upset vaginal irritation, itching  or burning This list may not describe all possible side effects. Call your doctor for medical advice about side effects. You may report side effects to FDA at1-800-FDA-1088. Where should I keep my medication? Keep out of the reach of children. Store at room temperature  between 15 and 30 degrees C (59 and 86 degrees F).Throw away any unused medicine after the expiration date. NOTE: This sheet is a summary. It may not cover all possible information. If you have questions about this medicine, talk to your doctor, pharmacist, orhealth care provider.  2022 Elsevier/Gold Standard (2008-02-06 13:51:27) Escitalopram Tablets What is this medication? ESCITALOPRAM (es sye TAL oh pram) treats depression and anxiety. It increases the amount of serotonin in the brain, a hormone that helps regulate mood. Itbelongs to a group of medications called SSRIs. This medicine may be used for other purposes; ask your health care provider orpharmacist if you have questions. COMMON BRAND NAME(S): Lexapro What should I tell my care team before I take this medication? They need to know if you have any of these conditions: Bipolar disorder or a family history of bipolar disorder Diabetes Glaucoma Heart disease Kidney or liver disease Receiving electroconvulsive therapy Seizures Suicidal thoughts, plans, or attempt by you or a family member An unusual or allergic reaction to escitalopram, the related medication citalopram, other medications, foods, dyes, or preservatives Pregnant or trying to become pregnant Breast-feeding How should I use this medication? Take this medication by mouth with a glass of water. Follow the directions on the prescription label. You can take it with or without food. If it upsets your stomach, take it with food. Take your medication at regular intervals. Do not take it more often than directed. Do not stop taking this medication suddenly except upon the advice of your care team. Stopping this medication too quicklymay cause serious side effects or your condition may worsen. A special MedGuide will be given to you by the pharmacist with eachprescription and refill. Be sure to read this information carefully each time. Talk to your care team regarding the use of  this medication in children.Special care may be needed. Overdosage: If you think you have taken too much of this medicine contact apoison control center or emergency room at once. NOTE: This medicine is only for you. Do not share this medicine with others. What if I miss a dose? If you miss a dose, take it as soon as you can. If it is almost time for yournext dose, take only that dose. Do not take double or extra doses. What may interact with this medication? Do not take this medication with any of the following: Certain medications for fungal infections like fluconazole, itraconazole, ketoconazole, posaconazole, voriconazole Cisapride Citalopram Dronedarone Linezolid MAOIs like Carbex, Eldepryl, Marplan, Nardil, and Parnate Methylene blue (injected into a vein) Pimozide Thioridazine This medication may also interact with the following: Alcohol Amphetamines Aspirin and aspirin-like medications Carbamazepine Certain medications for depression, anxiety, or psychotic disturbances Certain medications for migraine headache like almotriptan, eletriptan, frovatriptan, naratriptan, rizatriptan, sumatriptan, zolmitriptan Certain medications for sleep Certain medications that treat or prevent blood clots like warfarin, enoxaparin, dalteparin Cimetidine Diuretics Dofetilide Fentanyl Furazolidone Isoniazid Lithium Metoprolol NSAIDs, medications for pain and inflammation, like ibuprofen or naproxen Other medications that prolong the QT interval (cause an abnormal heart rhythm) Procarbazine Rasagiline Supplements like St. John's wort, kava kava, valerian Tramadol Tryptophan Ziprasidone This list may not describe all possible interactions. Give your health care provider a list of all the medicines, herbs, non-prescription drugs,  or dietary supplements you use. Also tell them if you smoke, drink alcohol, or use illegaldrugs. Some items may interact with your medicine. What should I watch  for while using this medication? Tell your care team if your symptoms do not get better or if they get worse. Visit your care team for regular checks on your progress. Because it may take several weeks to see the full effects of this medication, it is important tocontinue your treatment as prescribed by your care team. Watch for new or worsening thoughts of suicide or depression. This includes sudden changes in mood, behaviors, or thoughts. These changes can happen at any time but are more common in the beginning of treatment or after a change in dose. Call your care team right away if you experience these thoughts orworsening depression. Manic episodes may happen in patients with bipolar disorder who take this medication. Watch for changes in feelings or behaviors such as feeling anxious, nervous, agitated, panicky, irritable, hostile, aggressive, impulsive, severely restless, overly excited and hyperactive, or trouble sleeping. These symptoms can happen at any time but are more common in the beginning of treatment or after a change in dose. Call your care team right away if you notice any ofthese symptoms. You may get drowsy or dizzy. Do not drive, use machinery, or do anything that needs mental alertness until you know how this medication affects you. Do not stand or sit up quickly, especially if you are an older patient. This reduces the risk of dizzy or fainting spells. Alcohol may interfere with the effect ofthis medication. Avoid alcoholic drinks. Your mouth may get dry. Chewing sugarless gum or sucking hard candy, and drinking plenty of water may help. Contact your care team if the problem doesnot go away or is severe. What side effects may I notice from receiving this medication? Side effects that you should report to your care team as soon as possible: Allergic reactions-skin rash, itching, hives, swelling of the face, lips, tongue, or throat Bleeding-bloody or black, tar-like stools, red or dark  brown urine, vomiting blood or brown material that looks like coffee grounds, small, red or purple spots on skin, unusual bleeding or bruising Heart rhythm changes-fast or irregular heartbeat, dizziness, feeling faint or lightheaded, chest pain, trouble breathing Low sodium level-muscle weakness, fatigue, dizziness, headache, confusion Serotonin syndrome-irritability, confusion, fast or irregular heartbeat, muscle stiffness, twitching muscles, sweating, high fever, seizure, chills, vomiting, diarrhea Sudden eye pain or change in vision such as blurry vision, seeing halos around lights, vision loss Thoughts of suicide or self-harm, worsening mood, feelings of depression Side effects that usually do not require medical attention (report to your careteam if they continue or are bothersome): Change in sex drive or performance Diarrhea Excessive sweating Nausea Tremors or shaking Upset stomach This list may not describe all possible side effects. Call your doctor for medical advice about side effects. You may report side effects to FDA at1-800-FDA-1088. Where should I keep my medication? Keep out of reach of children and pets. Store at room temperature between 15 and 30 degrees C (59 and 86 degrees F).Throw away any unused medication after the expiration date. NOTE: This sheet is a summary. It may not cover all possible information. If you have questions about this medicine, talk to your doctor, pharmacist, orhealth care provider.  2022 Elsevier/Gold Standard (2020-04-13 09:53:34) Fat and Cholesterol Restricted Eating Plan Getting too much fat and cholesterol in your diet may cause health problems. Choosing the right foods helps keep your fat  and cholesterol at normal levels.This can keep you from getting certain diseases. Your doctor may recommend an eating plan that includes: Total fat: ______% or less of total calories a day. Saturated fat: ______% or less of total calories a day. Cholesterol:  less than _________mg a day. Fiber: ______g a day. What are tips for following this plan? Meal planning At meals, divide your plate into four equal parts: Fill one-half of your plate with vegetables and green salads. Fill one-fourth of your plate with whole grains. Fill one-fourth of your plate with low-fat (lean) protein foods. Eat fish that is high in omega-3 fats at least two times a week. This includes mackerel, tuna, sardines, and salmon. Eat foods that are high in fiber, such as whole grains, beans, apples, broccoli, carrots, peas, and barley. General tips  Work with your doctor to lose weight if you need to. Avoid: Foods with added sugar. Fried foods. Foods with partially hydrogenated oils. Limit alcohol intake to no more than 1 drink a day for nonpregnant women and 2 drinks a day for men. One drink equals 12 oz of beer, 5 oz of wine, or 1 oz of hard liquor.  Reading food labels Check food labels for: Trans fats. Partially hydrogenated oils. Saturated fat (g) in each serving. Cholesterol (mg) in each serving. Fiber (g) in each serving. Choose foods with healthy fats, such as: Monounsaturated fats. Polyunsaturated fats. Omega-3 fats. Choose grain products that have whole grains. Look for the word "whole" as the first word in the ingredient list. Cooking Cook foods using low-fat methods. These include baking, boiling, grilling, and broiling. Eat more home-cooked foods. Eat at restaurants and buffets less often. Avoid cooking using saturated fats, such as butter, cream, palm oil, palm kernel oil, and coconut oil. Recommended foods  Fruits All fresh, canned (in natural juice), or frozen fruits. Vegetables Fresh or frozen vegetables (raw, steamed, roasted, or grilled). Green salads. Grains Whole grains, such as whole wheat or whole grain breads, crackers, cereals, and pasta. Unsweetened oatmeal, bulgur, barley, quinoa, or brown rice. Corn or whole wheat flour  tortillas. Meats and other protein foods Ground beef (85% or leaner), grass-fed beef, or beef trimmed of fat. Skinless chicken or [place]. Ground chicken or [place]. Pork trimmed of fat. All fish and seafood. Egg whites. Dried beans, peas, or lentils. Unsalted nuts or seeds. Unsalted canned beans. Nut butters without added sugar or oil. Dairy Low-fat or nonfat dairy products, such as skim or 1% milk, 2% or reduced-fat cheeses, low-fat and fat-free ricotta or cottage cheese, or plain low-fat and nonfat yogurt. Fats and oils Tub margarine without trans fats. Light or reduced-fat mayonnaise and salad dressings. Avocado. Olive, canola, sesame, or safflower oils. The items listed above may not be a complete list of foods and beverages youcan eat. Contact a dietitian for more information. Foods to avoid Fruits Canned fruit in heavy syrup. Fruit in cream or butter sauce. Fried fruit. Vegetables Vegetables cooked in cheese, cream, or butter sauce. Fried vegetables. Grains White bread. White pasta. White rice. Cornbread. Bagels, pastries, and croissants. Crackers and snack foods that contain trans fat and hydrogenated oils. Meats and other protein foods Fatty cuts of meat. Ribs, chicken wings, bacon, sausage, bologna, salami, chitterlings, fatback, hot dogs, bratwurst, and packaged lunch meats. Liver and organ meats. Whole eggs and egg yolks. Chicken and [place] with skin. Fried meat. Dairy Whole or 2% milk, cream, half-and-half, and cream cheese. Whole milk cheeses. Whole-fat or sweetened yogurt. Full-fat cheeses. Nondairy creamers and  whipped toppings. Processed cheese, cheese spreads, and cheese curds. Beverages Alcohol. Sugar-sweetened drinks such as sodas, lemonade, and fruit drinks. Fats and oils Butter, stick margarine, lard, shortening, ghee, or bacon fat. Coconut, palm kernel, and palm oils. Sweets and desserts Corn syrup, sugars, honey, and molasses. Candy. Jam and jelly. Syrup. Sweetened  cereals. Cookies, pies, cakes, donuts, muffins, and ice cream. The items listed above may not be a complete list of foods and beverages youshould avoid. Contact a dietitian for more information. Summary Choosing the right foods helps keep your fat and cholesterol at normal levels. This can keep you from getting certain diseases. At meals, fill one-half of your plate with vegetables and green salads. Eat high-fiber foods, like whole grains, beans, apples, carrots, peas, and barley. Limit added sugar, saturated fats, alcohol, and fried foods. This information is not intended to replace advice given to you by your health care provider. Make sure you discuss any questions you have with your healthcare provider. Document Revised: 09/25/2019 Document Reviewed: 09/25/2019 Elsevier Patient Education  2022 [employer]

## 2020-11-17 NOTE — Progress Notes (Signed)
Established Patient Office Visit  Subjective:  Patient ID: Jenny Weber, female    DOB: Apr 23, 2001  Age: 20 y.o. MRN: [id number]  CC:  Chief Complaint  Patient presents with   Follow-up    Pt states she has no concerns and wants blood work done. Pt is fasting. Pt was asked about depression and anxiety screening and expressed symptoms of depression. Pt was given GAD-7 and PHQ-9. Results in chart.    HPI Jenny Weber presents for anxiety/ depression.   She feels down at times. Denies any suicidal or homicidal ideations or intents.   Has yeast infection she feels would like to try a round of Terazol as it cleared after use last time, did culture 6 months ago. Will return for recheck vaginal swab if no improvement.  Denies sore or lesion.   Patient  denies any fever, body aches,chills, rash, chest pain, shortness of breath, nausea, vomiting, or diarrhea.    Past Medical History:  Diagnosis Date   Allergy    Diabetes mellitus without complication (HCC)    BORDERLINE   Hypertension     History reviewed. No pertinent surgical history.  Family History  Problem Relation Age of Onset   Hypertension Father    Hypertension Maternal Grandmother    Hypertension Maternal Grandfather    Hypertension Paternal Grandmother    Hypertension Paternal Grandfather     Social History   Socioeconomic History   Marital status: Single    Spouse name: Not on file   Number of children: Not on file   Years of education: Not on file   Highest education level: Not on file  Occupational History   Not on file  Tobacco Use   Smoking status: Every Day    Pack years: 0.00    Types: Cigarettes   Smokeless tobacco: Never  Vaping Use   Vaping Use: Some days  Substance and Sexual Activity   Alcohol use: No   Drug use: No   Sexual activity: Never    Birth control/protection: None  Other Topics Concern   Not on file  Social History Narrative   Not on file   Social Determinants  of Health   Financial Resource Strain: Not on file  Food Insecurity: Not on file  Transportation Needs: Not on file  Physical Activity: Not on file  Stress: Not on file  Social Connections: Not on file  Intimate Partner Violence: Not on file    No outpatient medications prior to visit.   No facility-administered medications prior to visit.    Allergies  Allergen Reactions   Other Swelling    mushrooms   Shellfish Allergy Itching and Swelling    ROS Review of Systems    Objective:    Physical Exam Vitals reviewed.  Constitutional:      General: She is not in acute distress.    Appearance: She is well-developed. She is obese. She is not diaphoretic.     Interventions: She is not intubated. HENT:     Head: Normocephalic and atraumatic.     Right Ear: External ear normal.     Left Ear: External ear normal.     Nose: Nose normal.     Mouth/Throat:     Pharynx: No oropharyngeal exudate.  Eyes:     General: Lids are normal. No scleral icterus.       Right eye: No discharge.        Left eye: No discharge.     Conjunctiva/sclera:  Conjunctivae normal.     Right eye: Right conjunctiva is not injected. No exudate or hemorrhage.    Left eye: Left conjunctiva is not injected. No exudate or hemorrhage.    Pupils: Pupils are equal, round, and reactive to light.  Neck:     Thyroid: No thyroid mass or thyromegaly.     Vascular: Normal carotid pulses. No carotid bruit, hepatojugular reflux or JVD.     Trachea: Trachea and phonation normal. No tracheal tenderness or tracheal deviation.     Meningeal: Brudzinski's sign and Kernig's sign absent.  Cardiovascular:     Rate and Rhythm: Normal rate and regular rhythm.     Pulses: Normal pulses.          Radial pulses are 2+ on the right side and 2+ on the left side.       Dorsalis pedis pulses are 2+ on the right side and 2+ on the left side.       Posterior tibial pulses are 2+ on the right side and 2+ on the left side.     Heart  sounds: Normal heart sounds, S1 normal and S2 normal. Heart sounds not distant. No murmur heard.   No friction rub. No gallop.  Pulmonary:     Effort: Pulmonary effort is normal. No tachypnea, bradypnea, accessory muscle usage or respiratory distress. She is not intubated.     Breath sounds: Normal breath sounds. No stridor. No wheezing or rales.  Chest:     Chest wall: No tenderness.  Breasts:    Right: No supraclavicular adenopathy.     Left: No supraclavicular adenopathy.  Abdominal:     General: Bowel sounds are normal. There is no distension or abdominal bruit.     Palpations: Abdomen is soft. There is no shifting dullness, fluid wave, hepatomegaly, splenomegaly, mass or pulsatile mass.     Tenderness: There is no abdominal tenderness. There is no guarding or rebound.     Hernia: No hernia is present.  Musculoskeletal:        General: No deformity. Normal range of motion.     Cervical back: Normal, full passive range of motion without pain, normal range of motion and neck supple. No edema, erythema, rigidity or spasms. No spinous process tenderness or muscular tenderness. Normal range of motion.     Thoracic back: Normal.     Lumbar back: Spasms and tenderness present. No deformity, lacerations or bony tenderness. Normal range of motion.       Back:     Comments: Lumbarsacral muscle spasm - twisting/ bending worsens. Rates 3/10 on pain scale. Denies injury.   Lymphadenopathy:     Head:     Right side of head: No submental, submandibular, tonsillar, preauricular, posterior auricular or occipital adenopathy.     Left side of head: No submental, submandibular, tonsillar, preauricular, posterior auricular or occipital adenopathy.     Cervical: No cervical adenopathy.     Right cervical: No superficial, deep or posterior cervical adenopathy.    Left cervical: No superficial, deep or posterior cervical adenopathy.     Upper Body:     Right upper body: No supraclavicular or pectoral  adenopathy.     Left upper body: No supraclavicular or pectoral adenopathy.  Skin:    General: Skin is warm and dry.     Coloration: Skin is not pale.     Findings: No abrasion, bruising, burn, ecchymosis, erythema, lesion, petechiae or rash.     Nails: There is no clubbing.  Neurological:     Mental Status: She is alert and oriented to person, place, and time.     GCS: GCS eye subscore is 4. GCS verbal subscore is 5. GCS motor subscore is 6.     Cranial Nerves: No cranial nerve deficit.     Sensory: No sensory deficit.     Motor: No tremor, atrophy, abnormal muscle tone or seizure activity.     Coordination: Coordination normal.     Gait: Gait normal.     Deep Tendon Reflexes: Reflexes are normal and symmetric. Reflexes normal. Babinski sign absent on the right side. Babinski sign absent on the left side.     Reflex Scores:      Tricep reflexes are 2+ on the right side and 2+ on the left side.      Bicep reflexes are 2+ on the right side and 2+ on the left side.      Brachioradialis reflexes are 2+ on the right side and 2+ on the left side.      Patellar reflexes are 2+ on the right side and 2+ on the left side.      Achilles reflexes are 2+ on the right side and 2+ on the left side. Psychiatric:        Speech: Speech normal.        Behavior: Behavior normal.        Thought Content: Thought content normal.        Judgment: Judgment normal.    BP 116/72   Pulse 78   Temp 98.3 F (36.8 C)   Ht 5\' 5"  (1.651 m)   Wt (!) 311 lb 6.4 oz (141.3 kg)   LMP 11/03/2020   SpO2 99%   BMI 51.82 kg/m  Wt Readings from Last 3 Encounters:  11/17/20 (!) 311 lb 6.4 oz (141.3 kg)  03/31/20 (!) 305 lb 12.8 oz (138.7 kg)  11/08/19 (!) 304 lb (137.9 kg)     Health Maintenance Due  Topic Date Due   COVID-19 Vaccine (1) Never done   Pneumococcal Vaccine 29-64 Years old (1 - PCV) Never done   HIV Screening  Never done   HPV VACCINES (3 - 3-dose series) 01/28/2016       Topic Date Due    HPV VACCINES (3 - 3-dose series) 01/28/2016    No results found for: TSH Lab Results  Component Value Date   WBC 11.1 (H) 03/15/2018   HGB 11.6 (L) 03/15/2018   HCT 36.4 03/15/2018   MCV 81.6 03/15/2018   PLT 279 03/15/2018   Lab Results  Component Value Date   NA 139 03/15/2018   K 4.0 03/15/2018   CO2 23 03/15/2018   GLUCOSE 106 (H) 03/15/2018   BUN 10 03/15/2018   CREATININE 0.69 03/15/2018   CALCIUM 9.4 03/15/2018   ANIONGAP 8 03/15/2018   No results found for: CHOL No results found for: HDL No results found for: LDLCALC No results found for: TRIG No results found for: CHOLHDL No results found for: 05/15/2018    Assessment & Plan:   Problem List Items Addressed This Visit       Other   Vitamin D deficiency   Relevant Orders   VITAMIN D 25 Hydroxy (Vit-D Deficiency, Fractures)   Fatigue   Body mass index (BMI) of 50-59.9 in adult [facility])   Other Visit Diagnoses     Anxiety    -  Primary   Relevant Medications   escitalopram (LEXAPRO) 10 MG tablet  Other Relevant Orders   CBC with Differential/Platelet   Comprehensive metabolic panel   TSH   Lipid panel   B12 deficiency       Relevant Orders   B12 and Folate Panel   Screening for STD (sexually transmitted disease)       Relevant Orders   HIV antibody (with reflex)   Hepatitis, Acute   Screening for blood or protein in urine       Relevant Orders   Urine Microscopic Only   CULTURE, URINE COMPREHENSIVE   Vaginal yeast infection       Muscle spasm       Relevant Medications   baclofen (LIORESAL) 10 MG tablet      1. Anxiety Lexapro as ordered.    Discussed known black box warning for anti depression/ anxiety medication. Need to report any behavioral changes right, if any homicidal or suicidal thoughts or ideas seek medical attention right away. Call 911.   - CBC with Differential/Platelet - Comprehensive metabolic panel - TSH - Lipid panel  2. Vitamin D deficiency  - VITAMIN D 25 Hydroxy  (Vit-D Deficiency, Fractures)  3. Body mass index (BMI) of 50-59.9 in adult [facility]) The patient is advised to begin progressive daily aerobic exercise program, attempt to lose weight, reduce salt in diet and cooking, continue current medications, continue current healthy lifestyle patterns, and return for routine annual checkup.   4. B12 deficiency  - B12 and Folate Panel  5. Fatigue, unspecified type  Orders Placed This Encounter  Procedures   CULTURE, URINE COMPREHENSIVE   CBC with Differential/Platelet   Comprehensive metabolic panel   TSH   Lipid panel   HIV antibody (with reflex)   Hepatitis, Acute   VITAMIN D 25 Hydroxy (Vit-D Deficiency, Fractures)   Urine Microscopic Only   B12 and Folate Panel    Return if not improving. Labs previously ordered not done. Patient will go today.  6. Screening for STD (sexually transmitted disease) - HIV antibody (with reflex) - Hepatitis, Acute  7. Screening for blood or protein in urine  - Urine Microscopic Only - CULTURE, URINE COMPREHENSIVE  8. Vaginal yeast infection Terazol use and return for vaginal swab if not all the way resolved at anytime.   9. Muscle spasm Lower back spasms at times after work and lifting heavy objects. Return if not resolved at anytime.  - baclofen (LIORESAL) 10 MG tablet; Take 1 tablet (10 mg total) by mouth at bedtime as needed for muscle spasms (willl cause drowsiness).  Dispense: 30 each; Refill: 0   Meds ordered this encounter  Medications   escitalopram (LEXAPRO) 10 MG tablet    Sig: Take 1 tablet (10 mg total) by mouth daily.    Dispense:  60 tablet    Refill:  0   terconazole (TERAZOL 7) 0.4 % vaginal cream    Sig: Place 1 applicator vaginally at bedtime for 5 days.    Dispense:  45 g    Refill:  0   baclofen (LIORESAL) 10 MG tablet    Sig: Take 1 tablet (10 mg total) by mouth at bedtime as needed for muscle spasms (willl cause drowsiness).    Dispense:  30 each    Refill:  0   Red  Flags discussed. The patient was given clear instructions to go to ER or return to medical center if any red flags develop, symptoms do not improve, worsen or new problems develop. They verbalized understanding.   Follow-up: Return in about  6 weeks (around 12/29/2020), or if symptoms worsen or fail to improve, for at any time for any worsening symptoms, Go to Emergency room/ urgent care if worse.    Jairo Ben, FNP

## 2020-11-18 ENCOUNTER — Encounter: Payer: Self-pay | Admitting: Adult Health

## 2020-11-19 LAB — CULTURE, URINE COMPREHENSIVE
MICRO NUMBER:: 12005480
SPECIMEN QUALITY:: ADEQUATE

## 2020-11-20 ENCOUNTER — Other Ambulatory Visit: Payer: Self-pay | Admitting: Adult Health

## 2020-11-20 ENCOUNTER — Encounter: Payer: Self-pay | Admitting: Adult Health

## 2020-11-20 DIAGNOSIS — E559 Vitamin D deficiency, unspecified: Secondary | ICD-10-CM

## 2020-11-20 DIAGNOSIS — A499 Bacterial infection, unspecified: Secondary | ICD-10-CM

## 2020-11-20 DIAGNOSIS — D649 Anemia, unspecified: Secondary | ICD-10-CM

## 2020-11-20 MED ORDER — CEPHALEXIN 500 MG PO CAPS
500.0000 mg | ORAL_CAPSULE | Freq: Three times a day (TID) | ORAL | 0 refills | Status: DC
Start: 2020-11-20 — End: 2021-10-18

## 2020-11-20 NOTE — Progress Notes (Signed)
Gram positive bacteria in urine, could be just contamination with specimen. Will go ahead and send in keflex to take as directed. Please just verify list of allergies is correct on chart. Return if any persistent or worsening symptoms.  HIV screening negative.  Hepatitis acute panel is negative. TSH and lipid panel within normal limits as well as CMP. CBC shoes low hemoglobin 11.5, any heavy bleeding. I will place order for TIBC ferritin if can add on if not patient can return for this  Vitamin  D is low, this can contribute to poor sleep and fatigue, will send in prescription for Vitamin D at 50,000 units by mouth once every 7 days/(once weekly) for 12 weeks. Advise recheck lab Vitamin D in 1-2 weeks after completing vitamin d prescription. Labs need to be scheduled.  Medications  cephALEXin (KEFLEX) 500 MG capsule   Sig: Take 1 capsule (500 mg total) by mouth 3 (three) times daily.   Dispense:  21 capsule   Refill:  0  Orders Placed This Encounter Procedures  VITAMIN D 25 Hydroxy (Vit-D Deficiency, Fractures)  Iron, TIBC and Ferritin Panel   CMP is within normal limits.

## 2020-11-20 NOTE — Telephone Encounter (Signed)
I reviewed the labs and there is not anything urgent so this can wait for Jenny Weber to respond to them.

## 2020-11-20 NOTE — Progress Notes (Signed)
Meds ordered this encounter  Medications   cephALEXin (KEFLEX) 500 MG capsule    Sig: Take 1 capsule (500 mg total) by mouth 3 (three) times daily.    Dispense:  21 capsule    Refill:  0    Orders Placed This Encounter  Procedures   VITAMIN D 25 Hydroxy (Vit-D Deficiency, Fractures)   Iron, TIBC and Ferritin Panel

## 2020-11-22 LAB — IRON,TIBC AND FERRITIN PANEL
%SAT: 28 % (calc) (ref 16–45)
Ferritin: 33 ng/mL (ref 16–154)
Iron: 89 ug/dL (ref 40–190)
TIBC: 314 mcg/dL (calc) (ref 250–450)

## 2020-11-22 LAB — TEST AUTHORIZATION

## 2020-11-22 LAB — HEPATITIS PANEL, ACUTE
Hep A IgM: NONREACTIVE
Hep B C IgM: NONREACTIVE
Hepatitis B Surface Ag: NONREACTIVE
Hepatitis C Ab: NONREACTIVE
SIGNAL TO CUT-OFF: 0.04 (ref <1.00)

## 2020-11-22 LAB — HIV ANTIBODY (ROUTINE TESTING W REFLEX): HIV 1&2 Ab, 4th Generation: NONREACTIVE

## 2020-11-23 NOTE — Telephone Encounter (Signed)
Noted. Patient has been called for labs.

## 2020-11-25 NOTE — Progress Notes (Signed)
Received Dr. French Ana thanks.  HIV non reactive.  See other note.  Will have to add on TIBC , ferritin, and iron and schedule patient for this please ].

## 2020-12-01 ENCOUNTER — Telehealth: Payer: Self-pay

## 2020-12-01 NOTE — Telephone Encounter (Signed)
LMTCB. Need to schedule pt for a repeat non fasting lab appt in 3 months. Labs are ordered.

## 2021-02-22 ENCOUNTER — Other Ambulatory Visit: Payer: Self-pay

## 2021-02-22 ENCOUNTER — Other Ambulatory Visit: Payer: Self-pay | Admitting: Family

## 2021-02-22 ENCOUNTER — Other Ambulatory Visit (INDEPENDENT_AMBULATORY_CARE_PROVIDER_SITE_OTHER): Payer: Medicaid Other

## 2021-02-22 DIAGNOSIS — E559 Vitamin D deficiency, unspecified: Secondary | ICD-10-CM

## 2021-02-22 LAB — VITAMIN D 25 HYDROXY (VIT D DEFICIENCY, FRACTURES): VITD: 13.83 ng/mL — ABNORMAL LOW (ref 30.00–100.00)

## 2021-02-22 MED ORDER — VITAMIN D (ERGOCALCIFEROL) 1.25 MG (50000 UNIT) PO CAPS: 50000.0000 [IU] | ORAL_CAPSULE | ORAL | 1 refills | Status: DC

## 2021-02-26 ENCOUNTER — Telehealth: Payer: Self-pay

## 2021-02-26 ENCOUNTER — Encounter: Payer: Self-pay | Admitting: Adult Health

## 2021-02-26 DIAGNOSIS — E559 Vitamin D deficiency, unspecified: Secondary | ICD-10-CM

## 2021-02-26 NOTE — Telephone Encounter (Signed)
Orders placed for Vitamin D for future labs

## 2021-03-19 ENCOUNTER — Other Ambulatory Visit: Payer: Self-pay

## 2021-03-19 ENCOUNTER — Ambulatory Visit (INDEPENDENT_AMBULATORY_CARE_PROVIDER_SITE_OTHER): Payer: Medicaid Other | Admitting: Family

## 2021-03-19 ENCOUNTER — Other Ambulatory Visit (HOSPITAL_COMMUNITY)
Admission: RE | Admit: 2021-03-19 | Discharge: 2021-03-19 | Disposition: A | Discharge status: Home / Self Care | Payer: Medicaid Other | Source: Ambulatory Visit | Attending: Adult Health | Admitting: Adult Health

## 2021-03-19 VITALS — BP 110/62 | HR 97 | Temp 99.0°F | Ht 65.0 in | Wt 298.2 lb

## 2021-03-19 DIAGNOSIS — J029 Acute pharyngitis, unspecified: Secondary | ICD-10-CM

## 2021-03-19 DIAGNOSIS — N760 Acute vaginitis: Secondary | ICD-10-CM | POA: Insufficient documentation

## 2021-03-19 LAB — POCT RAPID STREP A (OFFICE): Rapid Strep A Screen: NEGATIVE

## 2021-03-19 NOTE — Patient Instructions (Signed)
Salt water gargles, honey, cough lozenges are helpful for sore throat.  We will await other test results.  Please let me know of any new concerns

## 2021-03-19 NOTE — Assessment & Plan Note (Addendum)
Patient is well-appearing today, nontoxic.  She is afebrile.  Strep test negative Awaiting COVID RSV and flu however anticipate likely negative.

## 2021-03-19 NOTE — Progress Notes (Signed)
Subjective:    Patient ID: Jenny Weber, female    DOB: [birth year], 20 y.o.   MRN: [id number]  CC: Jenny Weber is a 20 y.o. female who presents today for an acute visit.    HPI: Complains of increased vaginal discharge and concern for yeast infection x 3 weeks  Thick and white.  No fever, pelvic pain, pain with intercourse.  She has 1 partner and no concern for STDs.  No recent antibiotics.   Today she woke up with a sore throat and congestion.  She notes she slept last night with the fan on.  She reports seasonal allergies.  No shortness of breath, ear pain wheezing, cough, trouble swallowing   HISTORY:  Past Medical History:  Diagnosis Date   Allergy    Diabetes mellitus without complication (HCC)    BORDERLINE   Hypertension    No past surgical history on file. Family History  Problem Relation Age of Onset   Hypertension Father    Hypertension Maternal Grandmother    Hypertension Maternal Grandfather    Hypertension Paternal Grandmother    Hypertension Paternal Grandfather     Allergies: Other and Shellfish allergy Current Outpatient Medications on File Prior to Visit  Medication Sig Dispense Refill   baclofen (LIORESAL) 10 MG tablet Take 1 tablet (10 mg total) by mouth at bedtime as needed for muscle spasms (willl cause drowsiness). (Patient not taking: Reported on 03/19/2021) 30 each 0   cephALEXin (KEFLEX) 500 MG capsule Take 1 capsule (500 mg total) by mouth 3 (three) times daily. (Patient not taking: Reported on 03/19/2021) 21 capsule 0   escitalopram (LEXAPRO) 10 MG tablet Take 1 tablet (10 mg total) by mouth daily. (Patient not taking: Reported on 03/19/2021) 60 tablet 0   Vitamin D, Ergocalciferol, (DRISDOL) 1.25 MG (50000 UNIT) CAPS capsule Take 1 capsule (50,000 Units total) by mouth every 7 (seven) days. (Patient not taking: Reported on 03/19/2021) 12 capsule 1   No current facility-administered medications on file prior to visit.    Social  History   Tobacco Use   Smoking status: Every Day    Types: Cigarettes   Smokeless tobacco: Never  Vaping Use   Vaping Use: Some days  Substance Use Topics   Alcohol use: No   Drug use: No    Review of Systems  Constitutional:  Negative for chills and fever.  HENT:  Positive for sore throat. Negative for ear pain and trouble swallowing.   Respiratory:  Negative for cough.   Cardiovascular:  Negative for chest pain and palpitations.  Gastrointestinal:  Negative for nausea and vomiting.  Genitourinary:  Positive for vaginal discharge. Negative for pelvic pain and vaginal pain.     Objective:    BP 110/62 (BP Location: Left Arm, Patient Position: Sitting, Cuff Size: Large)   Pulse 97   Temp 99 F (37.2 C) (Oral)   Ht 5\' 5"  (1.651 m)   Wt 298 lb 3.2 oz (135.3 kg)   SpO2 98%   BMI 49.62 kg/m    Physical Exam Vitals reviewed.  Constitutional:      Appearance: She is well-developed.  HENT:     Head: Normocephalic and atraumatic.     Right Ear: Hearing, tympanic membrane, ear canal and external ear normal. No decreased hearing noted. No drainage, swelling or tenderness. No middle ear effusion. No foreign body. Tympanic membrane is not erythematous or bulging.     Left Ear: Hearing, tympanic membrane, ear canal and external ear  normal. No decreased hearing noted. No drainage, swelling or tenderness.  No middle ear effusion. No foreign body. Tympanic membrane is not erythematous or bulging.     Nose: Nose normal. No rhinorrhea.     Right Sinus: No maxillary sinus tenderness or frontal sinus tenderness.     Left Sinus: No maxillary sinus tenderness or frontal sinus tenderness.     Mouth/Throat:     Pharynx: Uvula midline. No oropharyngeal exudate or posterior oropharyngeal erythema.     Tonsils: No tonsillar abscesses.  Eyes:     Conjunctiva/sclera: Conjunctivae normal.  Cardiovascular:     Rate and Rhythm: Normal rate and regular rhythm.     Pulses: Normal pulses.     Heart  sounds: Normal heart sounds.  Pulmonary:     Effort: Pulmonary effort is normal.     Breath sounds: Normal breath sounds. No wheezing, rhonchi or rales.  Genitourinary:    Labia:        Right: No rash, tenderness or lesion.        Left: No rash, tenderness or lesion.      Vagina: No foreign body. No vaginal discharge, erythema, tenderness or bleeding.     Cervix: No cervical motion tenderness or discharge.     Adnexa:        Right: No mass, tenderness or fullness.         Left: No mass, tenderness or fullness.       Comments: No vulvovaginal erythema. No lesions. Discharge is thin, white, and clear, not purulent.  Lymphadenopathy:     Head:     Right side of head: No submental, submandibular, tonsillar, preauricular, posterior auricular or occipital adenopathy.     Left side of head: No submental, submandibular, tonsillar, preauricular, posterior auricular or occipital adenopathy.     Cervical: No cervical adenopathy.  Skin:    General: Skin is warm and dry.  Neurological:     Mental Status: She is alert.  Psychiatric:        Speech: Speech normal.        Behavior: Behavior normal.        Thought Content: Thought content normal.       Assessment & Plan:   Problem List Items Addressed This Visit       Genitourinary   Vaginitis    New.  presentation not entirely convincing for yeast today.  Will await vaginal swab result.      Relevant Orders   Cervicovaginal ancillary only( [place])     Other   Sore throat - Primary    Patient is well-appearing today, nontoxic.  She is afebrile.  Strep test negative Awaiting COVID RSV and flu however anticipate likely negative.      Relevant Orders   COVID-19, Flu A+B and RSV   POCT rapid strep A (Completed)      I am having Jenny Weber maintain her escitalopram, baclofen, cephALEXin, and Vitamin D (Ergocalciferol).   No orders of the defined types were placed in this encounter.   Return precautions given.    Risks, benefits, and alternatives of the medications and treatment plan prescribed today were discussed, and patient expressed understanding.   Education regarding symptom management and diagnosis given to patient on AVS.  Continue to follow with Flinchum, Eula Fried, FNP for routine health maintenance.   Jenny Weber and I agreed with plan.   Rennie Plowman, FNP

## 2021-03-19 NOTE — Assessment & Plan Note (Signed)
New.  presentation not entirely convincing for yeast today.  Will await vaginal swab result.

## 2021-03-20 LAB — COVID-19, FLU A+B AND RSV
Influenza A, NAA: NOT DETECTED
Influenza B, NAA: NOT DETECTED
RSV, NAA: NOT DETECTED
SARS-CoV-2, NAA: NOT DETECTED

## 2021-03-22 LAB — CERVICOVAGINAL ANCILLARY ONLY
Bacterial Vaginitis (gardnerella): NEGATIVE
Candida Glabrata: NEGATIVE
Candida Vaginitis: NEGATIVE
Chlamydia: NEGATIVE
Comment: NEGATIVE
Comment: NEGATIVE
Comment: NEGATIVE
Comment: NEGATIVE
Comment: NEGATIVE
Comment: NORMAL
Neisseria Gonorrhea: NEGATIVE
Trichomonas: NEGATIVE

## 2021-04-01 ENCOUNTER — Ambulatory Visit: Payer: Medicaid Other | Admitting: Adult Health

## 2021-04-06 ENCOUNTER — Other Ambulatory Visit: Payer: Medicaid Other

## 2021-04-09 ENCOUNTER — Ambulatory Visit: Payer: Medicaid Other | Admitting: Family

## 2021-04-12 ENCOUNTER — Ambulatory Visit: Payer: Medicaid Other | Admitting: Adult Health

## 2021-05-10 DIAGNOSIS — M2141 Flat foot [pes planus] (acquired), right foot: Secondary | ICD-10-CM | POA: Diagnosis not present

## 2021-05-10 DIAGNOSIS — M2142 Flat foot [pes planus] (acquired), left foot: Secondary | ICD-10-CM | POA: Diagnosis not present

## 2021-06-03 DIAGNOSIS — M2142 Flat foot [pes planus] (acquired), left foot: Secondary | ICD-10-CM | POA: Diagnosis not present

## 2021-06-03 DIAGNOSIS — M2141 Flat foot [pes planus] (acquired), right foot: Secondary | ICD-10-CM | POA: Diagnosis not present

## 2021-09-14 ENCOUNTER — Telehealth: Payer: Self-pay | Admitting: Adult Health

## 2021-09-14 NOTE — Telephone Encounter (Signed)
Tried to reach patient by phone no answer, voicemail is not setup if patient calls back need apt to  transfer care to close quality metric gaps. ?

## 2021-09-29 ENCOUNTER — Encounter: Payer: Medicaid Other | Admitting: Obstetrics

## 2021-10-18 ENCOUNTER — Encounter: Payer: Self-pay | Admitting: Family

## 2021-10-18 ENCOUNTER — Other Ambulatory Visit: Payer: Self-pay

## 2021-10-18 ENCOUNTER — Ambulatory Visit (INDEPENDENT_AMBULATORY_CARE_PROVIDER_SITE_OTHER): Payer: Medicaid Other | Admitting: Family

## 2021-10-18 ENCOUNTER — Other Ambulatory Visit (HOSPITAL_COMMUNITY)
Admission: RE | Admit: 2021-10-18 | Discharge: 2021-10-18 | Disposition: A | Discharge status: Home / Self Care | Payer: Medicaid Other | Source: Ambulatory Visit | Attending: Family | Admitting: Family

## 2021-10-18 VITALS — BP 123/78 | HR 82 | Temp 98.1°F | Ht 65.0 in | Wt 303.5 lb

## 2021-10-18 DIAGNOSIS — Z0000 Encounter for general adult medical examination without abnormal findings: Secondary | ICD-10-CM | POA: Insufficient documentation

## 2021-10-18 DIAGNOSIS — E049 Nontoxic goiter, unspecified: Secondary | ICD-10-CM | POA: Diagnosis not present

## 2021-10-18 DIAGNOSIS — F32A Depression, unspecified: Secondary | ICD-10-CM | POA: Diagnosis not present

## 2021-10-18 DIAGNOSIS — Z01419 Encounter for gynecological examination (general) (routine) without abnormal findings: Secondary | ICD-10-CM

## 2021-10-18 DIAGNOSIS — F419 Anxiety disorder, unspecified: Secondary | ICD-10-CM | POA: Diagnosis not present

## 2021-10-18 LAB — COMPREHENSIVE METABOLIC PANEL
ALT: 14 U/L (ref 0–35)
AST: 24 U/L (ref 0–37)
Albumin: 4 g/dL (ref 3.5–5.2)
Alkaline Phosphatase: 71 U/L (ref 39–117)
BUN: 19 mg/dL (ref 6–23)
CO2: 22 mEq/L (ref 19–32)
Calcium: 9.4 mg/dL (ref 8.4–10.5)
Chloride: 108 mEq/L (ref 96–112)
Creatinine, Ser: 0.78 mg/dL (ref 0.40–1.20)
GFR: 108.05 mL/min (ref >60.00)
Glucose, Bld: 81 mg/dL (ref 70–99)
Potassium: 4.5 mEq/L (ref 3.5–5.1)
Sodium: 139 mEq/L (ref 135–145)
Total Bilirubin: 0.3 mg/dL (ref 0.2–1.2)
Total Protein: 6.6 g/dL (ref 6.0–8.3)

## 2021-10-18 LAB — CBC WITH DIFFERENTIAL/PLATELET
Basophils Absolute: 0 10*3/uL (ref 0.0–0.1)
Basophils Relative: 0.2 % (ref 0.0–3.0)
Eosinophils Absolute: 0.3 10*3/uL (ref 0.0–0.7)
Eosinophils Relative: 2.6 % (ref 0.0–5.0)
HCT: 36.7 % (ref 36.0–46.0)
Hemoglobin: 12 g/dL (ref 12.0–15.0)
Lymphocytes Relative: 33.3 % (ref 12.0–46.0)
Lymphs Abs: 3.5 10*3/uL (ref 0.7–4.0)
MCHC: 32.7 g/dL (ref 30.0–36.0)
MCV: 85 fl (ref 78.0–100.0)
Monocytes Absolute: 0.9 10*3/uL (ref 0.1–1.0)
Monocytes Relative: 8.4 % (ref 3.0–12.0)
Neutro Abs: 5.9 10*3/uL (ref 1.4–7.7)
Neutrophils Relative %: 55.5 % (ref 43.0–77.0)
Platelets: 263 10*3/uL (ref 150.0–400.0)
RBC: 4.32 Mil/uL (ref 3.87–5.11)
RDW: 14.6 % (ref 11.5–15.5)
WBC: 10.6 10*3/uL — ABNORMAL HIGH (ref 4.0–10.5)

## 2021-10-18 LAB — LIPID PANEL
Cholesterol: 138 mg/dL (ref 0–200)
HDL: 45.9 mg/dL (ref >39.00)
LDL Cholesterol: 82 mg/dL (ref 0–99)
NonHDL: 92.2
Total CHOL/HDL Ratio: 3
Triglycerides: 52 mg/dL (ref 0.0–149.0)
VLDL: 10.4 mg/dL (ref 0.0–40.0)

## 2021-10-18 LAB — HEMOGLOBIN A1C: Hgb A1c MFr Bld: 5.9 % (ref 4.6–6.5)

## 2021-10-18 LAB — VITAMIN D 25 HYDROXY (VIT D DEFICIENCY, FRACTURES): VITD: 17 ng/mL — ABNORMAL LOW (ref 30.00–100.00)

## 2021-10-18 LAB — TSH: TSH: 1.12 u[IU]/mL (ref 0.35–5.50)

## 2021-10-18 MED ORDER — ESCITALOPRAM OXALATE 10 MG PO TABS
10.0000 mg | ORAL_TABLET | Freq: Every day | ORAL | 0 refills | Status: DC
  Filled 2021-10-18: qty 60, 60d supply, fill #0

## 2021-10-18 NOTE — Patient Instructions (Signed)
Start Lexapro ?Have also placed a referral for counseling ?Let us know if you dont hear back within a week in regards to an appointment being scheduled.  ? ?Our hope is for gradual improvement of mood since starting medication; however this may take several weeks.  ? ?If you start to have unusual thoughts, thoughts of hurting yourself, or anyone else, please go immediately to the emergency department.  ? ?Follow up in 6 weeks.  ? ?Please text to [phone] and write the word 'home'. This will put you in touch with trained crisis counselor and resources.  ? ? ?National Suicide Prevention Hotline - available 24 hours a day, 7 days a week.  ?[phone] ? ?Major Depressive Disorder ?Major depressive disorder is a mental illness. It also may be called clinical depression or unipolar depression. Major depressive disorder usually causes feelings of sadness, hopelessness, or helplessness. Some people with this disorder do not feel particularly sad but lose interest in doing things they used to enjoy (anhedonia). Major depressive disorder also can cause physical symptoms. It can interfere with work, school, relationships, and other normal everyday activities. The disorder varies in severity but is longer lasting and more serious than the sadness we all feel from time to time in our lives. ?Major depressive disorder often is triggered by stressful life events or major life changes. Examples of these triggers include divorce, loss of your job or home, a move, and the death of a family member or close friend. Sometimes this disorder occurs for no obvious reason at all. People who have family members with major depressive disorder or bipolar disorder are at higher risk for developing this disorder, with or without life stressors. Major depressive disorder can occur at any age. It may occur just once in your life (single episode major depressive disorder). It may occur multiple times (recurrent major depressive  disorder). ?SYMPTOMS ?People with major depressive disorder have either anhedonia or depressed mood on nearly a daily basis for at least 2 weeks or longer. Symptoms of depressed mood include: ?Feelings of sadness (blue or down in the dumps) or emptiness. ?Feelings of hopelessness or helplessness. ?Tearfulness or episodes of crying (may be observed by others). ?Irritability (children and adolescents). ?In addition to depressed mood or anhedonia or both, people with this disorder have at least four of the following symptoms: ?Difficulty sleeping or sleeping too much.   ?Significant change (increase or decrease) in appetite or weight.   ?Lack of energy or motivation. ?Feelings of guilt and worthlessness.   ?Difficulty concentrating, remembering, or making decisions. ?Unusually slow movement (psychomotor retardation) or restlessness (as observed by others).   ?Recurrent wishes for death, recurrent thoughts of self-harm (suicide), or a suicide attempt. ?People with major depressive disorder commonly have persistent negative thoughts about themselves, other people, and the world. People with severe major depressive disorder may experience distorted beliefs or perceptions about the world (psychotic delusions). They also may see or hear things that are not real (psychotic hallucinations). ?DIAGNOSIS ?Major depressive disorder is diagnosed through an assessment by your health care provider. Your health care provider will ask about aspects of your daily life, such as mood, sleep, and appetite, to see if you have the diagnostic symptoms of major depressive disorder. Your health care provider may ask about your medical history and use of alcohol or drugs, including prescription medicines. Your health care provider also may do a physical exam and blood work. This is because certain medical conditions and the use of certain substances can  cause major depressive disorder-like symptoms (secondary depression). Your health care  provider also may refer you to a mental health specialist for further evaluation and treatment. ?TREATMENT ?It is important to recognize the symptoms of major depressive disorder and seek treatment. The following treatments can be prescribed for this disorder:   ?Medicine. Antidepressant medicines usually are prescribed. Antidepressant medicines are thought to correct chemical imbalances in the brain that are commonly associated with major depressive disorder. Other types of medicine may be added if the symptoms do not respond to antidepressant medicines alone or if psychotic delusions or hallucinations occur. ?Talk therapy. Talk therapy can be helpful in treating major depressive disorder by providing support, education, and guidance. Certain types of talk therapy also can help with negative thinking (cognitive behavioral therapy) and with relationship issues that trigger this disorder (interpersonal therapy). ?A mental health specialist can help determine which treatment is best for you. Most people with major depressive disorder do well with a combination of medicine and talk therapy. Treatments involving electrical stimulation of the brain can be used in situations with extremely severe symptoms or when medicine and talk therapy do not work over time. These treatments include electroconvulsive therapy, transcranial magnetic stimulation, and vagal nerve stimulation. ?  ?This information is not intended to replace advice given to you by your health care provider. Make sure you discuss any questions you have with your health care provider. ?  ?Document Released: 09/17/2012 Document Revised: 06/13/2014 Document Reviewed: 09/17/2012 ?Elsevier Interactive Patient Education ?2016 [employer]vier [employer]. ? ? ? ? ?

## 2021-10-18 NOTE — Assessment & Plan Note (Signed)
Uncontrolled.  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  She did not try Lexapro in the past.  Reiterated the importance of picking up Lexapro and have sent in medication to [facility] pharmacy as she doesn't have insurance.  I have also placed referral for counseling.  Close follow-up. ?

## 2021-10-18 NOTE — Progress Notes (Signed)
Patient would like referral to talk to someone. Patient thinks she may have ADHD ?

## 2021-10-18 NOTE — Assessment & Plan Note (Signed)
CBC and Pap smear performed today.  Patient would like to sexually transmitted diseases such as chlamydia, gonorrhea and trichomonas tested with Pap smear.  She declines further testing for hepatitis C, syphilis or HIV.  Otherwise screening labs of been ordered.  Borderline thyromegaly on exam and ultrasound of thyroid has been ordered ?

## 2021-10-18 NOTE — Progress Notes (Signed)
? ?Subjective:  ? ? Patient ID: Jenny Weber, female    DOB: 01/22/2001, 21 y.o.   MRN: [id number] ? ?CC: Jenny Weber is a 21 y.o. female who presents today for physical exam.   ? ?HPI: She complains of anxiety and depression. She is grieving loss of uncle whom passed October of last year.  ?Grandfather diagnosed with liver cancer and she feels she is the strong one for her family.  ?She feels more irritable at work.  ?She is not sleeping well. Trouble staying asleep. ?No thoughts of hurting herself. No thoughts of hurting anyone else ? ?She forgot about lexapro and never started medication.,  ? ? ?No early family history of breast or colon cancer ? ?Cervical Cancer Screening: due ?No concern for STIs.  ? ?      Tetanus - due ?Exercise: Gets regular exercise walking, playing basketball  ?Alcohol use:  occasional ?Smoking/tobacco use: smokes marijuana daily ? ? ?HISTORY:  ?Past Medical History:  ?Diagnosis Date  ? Allergy   ? Diabetes mellitus without complication (HCC)   ? BORDERLINE  ? Hypertension   ?  ?History reviewed. No pertinent surgical history. ?Family History  ?Problem Relation Age of Onset  ? Hypertension Father   ? Hypertension Maternal Grandmother   ? Hypertension Maternal Grandfather   ? Hypertension Paternal Grandmother   ? Hypertension Paternal Grandfather   ? ?  ? ?ALLERGIES: Other and Shellfish allergy ? ?No current outpatient medications on file prior to visit.  ? ?No current facility-administered medications on file prior to visit.  ? ? ?Social History  ? ?Tobacco Use  ? Smoking status: Every Day  ?  Types: Cigarettes  ? Smokeless tobacco: Never  ?Vaping Use  ? Vaping Use: Some days  ?Substance Use Topics  ? Alcohol use: No  ? Drug use: No  ? ? ?Review of Systems  ?Constitutional:  Negative for chills, fever and unexpected weight change.  ?HENT:  Negative for congestion.   ?Respiratory:  Negative for cough.   ?Cardiovascular:  Negative for chest pain, palpitations and leg swelling.   ?Gastrointestinal:  Negative for nausea and vomiting.  ?Musculoskeletal:  Negative for arthralgias and myalgias.  ?Skin:  Negative for rash.  ?Neurological:  Negative for headaches.  ?Hematological:  Negative for adenopathy.  ?Psychiatric/Behavioral:  Positive for sleep disturbance. Negative for confusion and suicidal ideas. The patient is nervous/anxious.   ?   ?Objective:  ?  ?BP 123/78 (BP Location: Left Arm, Patient Position: Sitting, Cuff Size: Normal)   Pulse 82   Temp 98.1 ?F (36.7 ?C) (Oral)   Ht 5\' 5"  (1.651 m)   Wt (!) 303 lb 8 oz (137.7 kg)   LMP  (LMP Unknown)   SpO2 98%   BMI 50.51 kg/m?  ? ?BP Readings from Last 3 Encounters:  ?10/18/21 123/78  ?03/19/21 110/62  ?11/17/20 116/72  ? ?Wt Readings from Last 3 Encounters:  ?10/18/21 (!) 303 lb 8 oz (137.7 kg)  ?03/19/21 298 lb 3.2 oz (135.3 kg)  ?11/17/20 (!) 311 lb 6.4 oz (141.3 kg)  ? ? ?Physical Exam ?Vitals reviewed.  ?Constitutional:   ?   Appearance: Normal appearance. She is well-developed.  ?Eyes:  ?   Conjunctiva/sclera: Conjunctivae normal.  ?Neck:  ?   Thyroid: Thyromegaly present. No thyroid mass or thyroid tenderness.  ?Cardiovascular:  ?   Rate and Rhythm: Normal rate and regular rhythm.  ?   Pulses: Normal pulses.  ?   Heart sounds: Normal heart sounds.  ?  Pulmonary:  ?   Effort: Pulmonary effort is normal.  ?   Breath sounds: Normal breath sounds. No wheezing, rhonchi or rales.  ?Chest:  ?Breasts: ?   Breasts are symmetrical.  ?   Right: No inverted nipple, mass, nipple discharge, skin change or tenderness.  ?   Left: No inverted nipple, mass, nipple discharge, skin change or tenderness.  ?Abdominal:  ?   General: Bowel sounds are normal. There is no distension.  ?   Palpations: Abdomen is soft. Abdomen is not rigid. There is no fluid wave or mass.  ?   Tenderness: There is no abdominal tenderness. There is no guarding or rebound.  ?Genitourinary: ?   Cervix: No cervical motion tenderness, discharge or friability.  ?   Uterus: Not  enlarged, not fixed and not tender.   ?   Adnexa:     ?   Right: No mass, tenderness or fullness.      ?   Left: No mass, tenderness or fullness.    ?   Comments: Pap performed. No CMT. Unable to appreciated ovaries. ?Lymphadenopathy:  ?   Head:  ?   Right side of head: No submental, submandibular, tonsillar, preauricular, posterior auricular or occipital adenopathy.  ?   Left side of head: No submental, submandibular, tonsillar, preauricular, posterior auricular or occipital adenopathy.  ?   Cervical:  ?   Right cervical: No superficial, deep or posterior cervical adenopathy. ?   Left cervical: No superficial, deep or posterior cervical adenopathy.  ?   Upper Body:  ?   Right upper body: No pectoral adenopathy.  ?   Left upper body: No pectoral adenopathy.  ?Skin: ?   General: Skin is warm and dry.  ?Neurological:  ?   Mental Status: She is alert.  ?Psychiatric:     ?   Speech: Speech normal.     ?   Behavior: Behavior normal.     ?   Thought Content: Thought content normal.  ? ? ?   ?Assessment & Plan:  ? ?Problem List Items Addressed This Visit   ? ?  ? Other  ? Anxiety and depression  ?  Uncontrolled.  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  She did not try Lexapro in the past.  Reiterated the importance of picking up Lexapro and have sent in medication to [facility] pharmacy as she doesn't have insurance.  I have also placed referral for counseling.  Close follow-up. ? ?  ?  ? Relevant Medications  ? escitalopram (LEXAPRO) 10 MG tablet  ? Other Relevant Orders  ? Ambulatory referral to Psychology  ? Well female exam with routine gynecological exam  ?  CBC and Pap smear performed today.  Patient would like to sexually transmitted diseases such as chlamydia, gonorrhea and trichomonas tested with Pap smear.  She declines further testing for hepatitis C, syphilis or HIV.  Otherwise screening labs of been ordered.  Borderline thyromegaly on exam and ultrasound of thyroid has been ordered ? ?  ?  ? ?Other Visit  Diagnoses   ? ? Routine physical examination    -  Primary  ? Relevant Orders  ? CBC with Differential/Platelet  ? Comprehensive metabolic panel  ? Cytology - PAP  ? Hemoglobin A1c  ? Lipid panel  ? TSH  ? VITAMIN D 25 Hydroxy (Vit-D Deficiency, Fractures)  ? Enlarged thyroid      ? Relevant Orders  ? US THYROID  ? ?  ? ? ? ?I  have discontinued Jenny Weber baclofen, cephALEXin, and Vitamin D (Ergocalciferol). I have also changed her escitalopram. ? ? ?Meds ordered this encounter  ?Medications  ? escitalopram (LEXAPRO) 10 MG tablet  ?  Sig: Take 1 tablet (10 mg total) by mouth once daily.  ?  Dispense:  60 tablet  ?  Refill:  0  ? ? ?Return precautions given.  ? ?Risks, benefits, and alternatives of the medications and treatment plan prescribed today were discussed, and patient expressed understanding.  ? ?Education regarding symptom management and diagnosis given to patient on AVS.  ? ?Continue to follow with Allegra Grana, FNP for routine health maintenance.  ? ?Jenny Reek and I agreed with plan.  ? ?Rennie Plowman, FNP ? ? ? ? ?

## 2021-10-21 LAB — CYTOLOGY - PAP
Chlamydia: NEGATIVE
Comment: NEGATIVE
Comment: NEGATIVE
Comment: NORMAL
Diagnosis: UNDETERMINED — AB
Neisseria Gonorrhea: NEGATIVE
Trichomonas: NEGATIVE

## 2021-10-25 ENCOUNTER — Other Ambulatory Visit: Payer: Self-pay | Admitting: Family

## 2021-10-25 DIAGNOSIS — E559 Vitamin D deficiency, unspecified: Secondary | ICD-10-CM

## 2021-10-25 MED ORDER — CHOLECALCIFEROL 1.25 MG (50000 UT) PO TABS: ORAL_TABLET | ORAL | 0 refills | Status: DC

## 2021-11-02 ENCOUNTER — Inpatient Hospital Stay: Admission: RE | Admit: 2021-11-02 | Payer: Medicaid Other | Source: Ambulatory Visit

## 2021-11-04 ENCOUNTER — Ambulatory Visit
Admission: RE | Admit: 2021-11-04 | Discharge: 2021-11-04 | Disposition: A | Discharge status: Home / Self Care | Payer: Medicaid Other | Source: Ambulatory Visit | Attending: Family | Admitting: Family

## 2021-11-04 DIAGNOSIS — E049 Nontoxic goiter, unspecified: Secondary | ICD-10-CM | POA: Diagnosis not present

## 2021-11-05 ENCOUNTER — Ambulatory Visit: Payer: Medicaid Other | Admitting: Family

## 2021-12-20 ENCOUNTER — Ambulatory Visit: Payer: Medicaid Other | Admitting: Family

## 2022-01-05 ENCOUNTER — Ambulatory Visit: Payer: Medicaid Other | Admitting: Family

## 2022-02-01 NOTE — Progress Notes (Deleted)
Psychiatric Initial Adult Assessment  Patient Identification: Jenny Weber MRN:  [id number] Date of Evaluation:  02/01/2022 Referral Source: Rennie Plowman, FNP  Assessment:  Jenny Weber is a 21 y.o. y.o. female with a history of anxiety, depression, Vitamin D deficiency, migraines, and prediabetes *** who presents in person to [facility] for initial evaluation of anxiety and depressive symptoms.  Patient reports ***  Plan:  # Depression  Anxiety Past medication trials:  Status of problem: *** Interventions: -- ***  # *** Past medication trials:  Status of problem: *** Interventions: -- ***  # *** Past medication trials:  Status of problem: *** Interventions: -- ***  Patient was given contact information for behavioral health clinic and was instructed to call 911 for emergencies.   Subjective:  Chief Complaint: No chief complaint on file.   History of Present Illness:  ***  Referral: Uncontrolled anxiety/depression.  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  She did not try Lexapro in the past.  Reiterated the importance of picking up Lexapro and have sent in medication to [facility]ication assistance pharmacy as she doesn't have insurance.  I have also placed referral for counseling.    Taking lexapro? Vitamin d?  Consider WBT? R/o seizure SSRI black box warning   Associated Signs/Symptoms: Depression Symptoms:  {DEPRESSION SYMPTOMS:20000} (Hypo) Manic Symptoms:  {BHH MANIC SYMPTOMS:22872} Anxiety Symptoms:  {BHH ANXIETY SYMPTOMS:22873} Psychotic Symptoms:  {BHH PSYCHOTIC SYMPTOMS:22874} PTSD Symptoms: {BHH PTSD SYMPTOMS:22875}  Past Psychiatric History: *** Diagnoses: *** Suicide attempts: *** Hospitalizations: *** Therapy: ***  Previous Psychotropic Medications: {YES/NO:21197}  Substance Abuse History in the last 12 months:  {yes no:314532}  Consequences of Substance Abuse: {BHH CONSEQUENCES OF SUBSTANCE ABUSE:22880}  Past  Medical History:  Past Medical History:  Diagnosis Date   Allergy    Diabetes mellitus without complication (HCC)    BORDERLINE   Hypertension    No past surgical history on file.  Family Psychiatric History: ***  Family History:  Family History  Problem Relation Age of Onset   Hypertension Father    Hypertension Maternal Grandmother    Hypertension Maternal Grandfather    Hypertension Paternal Grandmother    Hypertension Paternal Grandfather     Social History:   Social History   Socioeconomic History   Marital status: Single    Spouse name: Not on file   Number of children: Not on file   Years of education: Not on file   Highest education level: Not on file  Occupational History   Not on file  Tobacco Use   Smoking status: Every Day    Types: Cigarettes   Smokeless tobacco: Never  Vaping Use   Vaping Use: Some days  Substance and Sexual Activity   Alcohol use: No   Drug use: No   Sexual activity: Never    Birth control/protection: None  Other Topics Concern   Not on file  Social History Narrative   [employer] at [employer]   Social Determinants of Health   Financial Resource Strain: Not on file  Food Insecurity: Not on file  Transportation Needs: Not on file  Physical Activity: Not on file  Stress: Not on file  Social Connections: Not on file    Additional Social History: ***  Allergies:   Allergies  Allergen Reactions   Other Swelling    mushrooms   Shellfish Allergy Itching and Swelling    Current Medications: Current Outpatient Medications  Medication Sig Dispense Refill   Cholecalciferol 1.25 MG (50000 UT) TABS  50,000 units PO qwk for 8 weeks. 8 tablet 0   escitalopram (LEXAPRO) 10 MG tablet Take 1 tablet (10 mg total) by mouth once daily. 60 tablet 0   No current facility-administered medications for this visit.    ROS: Review of Systems  Objective:  Psychiatric Specialty Exam: There were no vitals taken for this visit.There is no  height or weight on file to calculate BMI.  General Appearance: {Appearance:22683}  Eye Contact:  {BHH EYE CONTACT:22684}  Speech:  {Speech:22685}  Volume:  {Volume (PAA):22686}  Mood:  {BHH MOOD:22306}  Affect:  {Affect (PAA):22687}  Thought Process:  {Thought Process (PAA):22688}  Orientation:  {BHH ORIENTATION (PAA):22689}  Thought Content:  {Thought Content:22690}  Suicidal Thoughts:  {ST/HT (PAA):22692}  Homicidal Thoughts:  {ST/HT (PAA):22692}  Memory:  {BHH ZWCHEN:27782}  Judgment:  {Judgement (PAA):22694}  Insight:  {Insight (PAA):22695}  Psychomotor Activity:  {Psychomotor (PAA):22696}  Concentration:  {Concentration:21399}  Recall:  {BHH GOOD/FAIR/POOR:22877}  Fund of Knowledge:{BHH GOOD/FAIR/POOR:22877}  Language: {BHH GOOD/FAIR/POOR:22877}  Akathisia:  {BHH YES OR NO:22294}  Handed:  {Handed:22697}  AIMS (if indicated):  {Desc; done/not:10129}  Assets:  {Assets (PAA):22698}  ADL's:  {BHH UMP'N:36144}  Cognition: {chl bhh cognition:304700322}  Sleep:  {BHH GOOD/FAIR/POOR:22877}   PE: General: well-appearing; no acute distress *** Pulm: no increased work of breathing on room air *** Strength & Muscle Tone: {desc; muscle tone:32375} Neuro: no focal neurological deficits observed *** Gait & Station: {PE GAIT ED RXVQ:00867}  Metabolic Disorder Labs: Lab Results  Component Value Date   HGBA1C 5.9 10/18/2021   No results found for: "PROLACTIN" Lab Results  Component Value Date   CHOL 138 10/18/2021   TRIG 52.0 10/18/2021   HDL 45.90 10/18/2021   CHOLHDL 3 10/18/2021   VLDL 10.4 10/18/2021   LDLCALC 82 10/18/2021   LDLCALC 84 11/17/2020   Lab Results  Component Value Date   TSH 1.12 10/18/2021   Last vitamin D Lab Results  Component Value Date   VD25OH 17.00 (L) 10/18/2021     Therapeutic Level Labs: No results found for: "LITHIUM" No results found for: "CBMZ" No results found for: "VALPROATE"  Screenings:  GAD-7    Flowsheet Row Office Visit  from 10/18/2021 in [facility] [place] Office Visit from 11/17/2020 in [facility] [place]  Total GAD-7 Score 9 16      PHQ2-9    Flowsheet Row Office Visit from 10/18/2021 in [facility] [place] Office Visit from 11/17/2020 in [facility]  Office Visit from 11/08/2019 in [facility]  PHQ-2 Total Score 6 5 2   PHQ-9 Total Score 22 18 15        Collaboration of Care: Collaboration of Care: [facility] OP Collaboration of  Patient/Guardian was advised Release of Information must be obtained prior to any record release in order to collaborate their care with an outside provider. Patient/Guardian was advised if they have not already done so to contact the registration department to sign all necessary forms in order for [facility] to release information regarding their care.   Consent: Patient/Guardian gives verbal consent for treatment and assignment of benefits for services provided during this visit. Patient/Guardian expressed understanding and agreed to proceed.   A total of *** minutes was spent involved in face to face clinical care, chart review, documentation, and ***.   Harrell Niehoff A Jill Ruppe 8/29/20239:53 PM

## 2022-02-02 ENCOUNTER — Ambulatory Visit (HOSPITAL_COMMUNITY): Payer: Medicaid Other | Admitting: Psychiatry

## 2022-02-09 ENCOUNTER — Ambulatory Visit: Payer: Medicaid Other | Admitting: Family

## 2022-04-13 ENCOUNTER — Ambulatory Visit: Payer: Medicaid Other | Admitting: Family

## 2022-04-19 DIAGNOSIS — F32A Depression, unspecified: Secondary | ICD-10-CM | POA: Diagnosis not present

## 2022-04-19 DIAGNOSIS — W503XXA Accidental bite by another person, initial encounter: Secondary | ICD-10-CM | POA: Diagnosis not present

## 2022-04-19 DIAGNOSIS — S5012XA Contusion of left forearm, initial encounter: Secondary | ICD-10-CM | POA: Diagnosis not present

## 2023-02-27 ENCOUNTER — Encounter: Payer: Self-pay | Admitting: Family

## 2023-02-27 ENCOUNTER — Ambulatory Visit (INDEPENDENT_AMBULATORY_CARE_PROVIDER_SITE_OTHER): Payer: Medicaid Other | Admitting: Family

## 2023-02-27 VITALS — BP 126/78 | HR 87 | Temp 98.4°F | Ht 65.0 in | Wt 322.6 lb

## 2023-02-27 DIAGNOSIS — Z8639 Personal history of other endocrine, nutritional and metabolic disease: Secondary | ICD-10-CM

## 2023-02-27 DIAGNOSIS — F32A Depression, unspecified: Secondary | ICD-10-CM | POA: Diagnosis not present

## 2023-02-27 DIAGNOSIS — M545 Low back pain, unspecified: Secondary | ICD-10-CM | POA: Insufficient documentation

## 2023-02-27 DIAGNOSIS — M546 Pain in thoracic spine: Secondary | ICD-10-CM | POA: Insufficient documentation

## 2023-02-27 DIAGNOSIS — F419 Anxiety disorder, unspecified: Secondary | ICD-10-CM | POA: Diagnosis not present

## 2023-02-27 DIAGNOSIS — R899 Unspecified abnormal finding in specimens from other organs, systems and tissues: Secondary | ICD-10-CM

## 2023-02-27 DIAGNOSIS — Z87898 Personal history of other specified conditions: Secondary | ICD-10-CM | POA: Diagnosis not present

## 2023-02-27 LAB — COMPREHENSIVE METABOLIC PANEL
ALT: 11 U/L (ref 0–35)
AST: 14 U/L (ref 0–37)
Albumin: 3.8 g/dL (ref 3.5–5.2)
Alkaline Phosphatase: 65 U/L (ref 39–117)
BUN: 13 mg/dL (ref 6–23)
CO2: 26 mEq/L (ref 19–32)
Calcium: 9.2 mg/dL (ref 8.4–10.5)
Chloride: 106 mEq/L (ref 96–112)
Creatinine, Ser: 0.69 mg/dL (ref 0.40–1.20)
GFR: 122.29 mL/min (ref >60.00)
Glucose, Bld: 94 mg/dL (ref 70–99)
Potassium: 4.5 mEq/L (ref 3.5–5.1)
Sodium: 138 mEq/L (ref 135–145)
Total Bilirubin: 0.4 mg/dL (ref 0.2–1.2)
Total Protein: 6.9 g/dL (ref 6.0–8.3)

## 2023-02-27 LAB — CBC WITH DIFFERENTIAL/PLATELET
Basophils Absolute: 0 10*3/uL (ref 0.0–0.1)
Basophils Relative: 0.5 % (ref 0.0–3.0)
Eosinophils Absolute: 0.6 10*3/uL (ref 0.0–0.7)
Eosinophils Relative: 5.6 % — ABNORMAL HIGH (ref 0.0–5.0)
HCT: 38.5 % (ref 36.0–46.0)
Hemoglobin: 12.1 g/dL (ref 12.0–15.0)
Lymphocytes Relative: 34 % (ref 12.0–46.0)
Lymphs Abs: 3.3 10*3/uL (ref 0.7–4.0)
MCHC: 31.4 g/dL (ref 30.0–36.0)
MCV: 85.1 fl (ref 78.0–100.0)
Monocytes Absolute: 0.6 10*3/uL (ref 0.1–1.0)
Monocytes Relative: 6.4 % (ref 3.0–12.0)
Neutro Abs: 5.3 10*3/uL (ref 1.4–7.7)
Neutrophils Relative %: 53.5 % (ref 43.0–77.0)
Platelets: 246 10*3/uL (ref 150.0–400.0)
RBC: 4.52 Mil/uL (ref 3.87–5.11)
RDW: 14.6 % (ref 11.5–15.5)
WBC: 9.8 10*3/uL (ref 4.0–10.5)

## 2023-02-27 LAB — URINALYSIS, ROUTINE W REFLEX MICROSCOPIC
Bilirubin Urine: NEGATIVE
Hgb urine dipstick: NEGATIVE
Ketones, ur: NEGATIVE
Leukocytes,Ua: NEGATIVE
Nitrite: NEGATIVE
RBC / HPF: NONE SEEN (ref 0–?)
Specific Gravity, Urine: 1.02 (ref 1.000–1.030)
Total Protein, Urine: NEGATIVE
Urine Glucose: NEGATIVE
Urobilinogen, UA: 0.2 (ref 0.0–1.0)
pH: 7.5 (ref 5.0–8.0)

## 2023-02-27 LAB — VITAMIN D 25 HYDROXY (VIT D DEFICIENCY, FRACTURES): VITD: 13.65 ng/mL — ABNORMAL LOW (ref 30.00–100.00)

## 2023-02-27 LAB — TSH: TSH: 1.53 u[IU]/mL (ref 0.35–5.50)

## 2023-02-27 LAB — HEMOGLOBIN A1C: Hgb A1c MFr Bld: 6.1 % (ref 4.6–6.5)

## 2023-02-27 MED ORDER — CYCLOBENZAPRINE HCL 5 MG PO TABS
5.0000 mg | ORAL_TABLET | Freq: Every day | ORAL | 1 refills | Status: DC
Start: 2023-02-27 — End: 2023-04-10

## 2023-02-27 MED ORDER — OMEPRAZOLE 20 MG PO CPDR
20.0000 mg | DELAYED_RELEASE_CAPSULE | Freq: Every day | ORAL | 1 refills | Status: DC
Start: 2023-02-27 — End: 2023-04-10

## 2023-02-27 MED ORDER — MELOXICAM 7.5 MG PO TABS
7.5000 mg | ORAL_TABLET | Freq: Every day | ORAL | 1 refills | Status: DC | PRN
Start: 2023-02-27 — End: 2023-04-10

## 2023-02-27 NOTE — Assessment & Plan Note (Addendum)
No alarm features at this time.  We discussed nature of her work and likely aggravating.  We agreed to start with conservative management including a trial of meloxicam.  I provided prescription for omeprazole to take while on a course of meloxicam.  I have also provided her with Flexeril to use sparingly at nighttime.  Counseled on sedation properties of Flexeril and to take with narcotic. Physical therapy referral has been placed.  We deferred imaging at this time however if pain fails to improve at follow-up, will order baseline thoracic and lumbar x-ray.

## 2023-02-27 NOTE — Assessment & Plan Note (Signed)
Profound loss of loved ones over the past couple of years.  We discussed grief and coping mechanisms.  Provided patient with information about [email or web address] and have also placed individual referral for patient.  Patient was not inclined to trial Lexapro at this time although we will continue to discuss this at follow-up.  She may be a candidate for Cymbalta particularly if back pain persists

## 2023-02-27 NOTE — Progress Notes (Unsigned)
Assessment & Plan:  Acute bilateral low back pain without sciatica Assessment & Plan: No alarm features at this time.  We discussed nature of her work and likely aggravating.  We agreed to start with conservative management including a trial of meloxicam.  I provided prescription for omeprazole to take while on a course of meloxicam.  I have also provided her with Flexeril to use sparingly at nighttime.  Counseled on sedation properties of Flexeril and to take with narcotic. Physical therapy referral has been placed.  We deferred imaging at this time however if pain fails to improve at follow-up, will order baseline thoracic and lumbar x-ray.  Orders: -     Urinalysis, Routine w reflex microscopic -     Omeprazole; Take 1 capsule (20 mg total) by mouth daily.  Dispense: 30 capsule; Refill: 1 -     Ambulatory referral to Physical Therapy -     Cyclobenzaprine HCl; Take 1 tablet (5 mg total) by mouth at bedtime.  Dispense: 30 tablet; Refill: 1  History of prediabetes -     Hemoglobin A1c  History of vitamin D deficiency -     VITAMIN D 25 Hydroxy (Vit-D Deficiency, Fractures)  Anxiety and depression Assessment & Plan: Profound loss of loved ones over the past couple of years.  We discussed grief and coping mechanisms.  Provided patient with information about [email or web address] and have also placed individual referral for patient.  Patient was not inclined to trial Lexapro at this time although we will continue to discuss this at follow-up.  She may be a candidate for Cymbalta particularly if back pain persists  Orders: -     TSH -     Ambulatory referral to Psychology  Abnormal laboratory test -     CBC with Differential/Platelet -     Comprehensive metabolic panel  Other orders -     Meloxicam; Take 1 tablet (7.5 mg total) by mouth daily as needed for pain.  Dispense: 30 tablet; Refill: 1     Return precautions given.   Risks, benefits, and alternatives of the medications and  treatment plan prescribed today were discussed, and patient expressed understanding.   Education regarding symptom management and diagnosis given to patient on AVS either electronically or printed.  Return in about 6 weeks (around 04/10/2023) for Complete Physical Exam.  Rennie Plowman, FNP  Subjective:    Patient ID: Jenny Weber, female    DOB: 03-19-2001, 22 y.o.   MRN: [id number]  CC: Jenny Weber is a 22 y.o. female who presents today for follow up.   HPI: Complains of mid to low back pain x 4 months, unchanged.    She tried a percocet, Excedrin, tylenol 650mg  intermittently without relief.  She works at [employer] , standing 8-9 hours per day.   Back pain is worse after long periods of sitting and standing. Most comfortable laying on left side.   Occasional pain in right knee.   No hematuria, groin pain, numbness, urinary or fecal incontinence, abdominal pain.    Menses was last week     She continues to mourn the loss of an uncle 2 years ago and multiple family members over the course past couple years.  She did not start Lexapro .  Denies thoughts of hurting herself or anyone else  She never started vitamin D  Allergies: Other and Shellfish allergy No current outpatient medications on file prior to visit.   No current facility-administered medications on file prior to  visit.    Review of Systems  Constitutional:  Negative for chills and fever.  Respiratory:  Negative for cough.   Cardiovascular:  Negative for chest pain and palpitations.  Gastrointestinal:  Negative for nausea and vomiting.  Genitourinary:  Negative for dysuria.  Musculoskeletal:  Positive for back pain.  Neurological:  Negative for numbness.  Psychiatric/Behavioral:  Negative for suicidal ideas.       Objective:    There were no vitals taken for this visit. BP Readings from Last 3 Encounters:  10/18/21 123/78  03/19/21 110/62  11/17/20 116/72   Wt Readings from Last 3  Encounters:  10/18/21 (!) 303 lb 8 oz (137.7 kg)  03/19/21 298 lb 3.2 oz (135.3 kg)  11/17/20 (!) 311 lb 6.4 oz (141.3 kg)      02/27/2023    8:17 AM 10/18/2021    9:26 AM 11/17/2020    8:13 AM  Depression screen PHQ 2/9  Decreased Interest 1 3 2   Down, Depressed, Hopeless 1 3 3   PHQ - 2 Score 2 6 5   Altered sleeping 3 3 2   Tired, decreased energy 3 3 2   Change in appetite 2 3 3   Feeling bad or failure about yourself  2 2 1   Trouble concentrating 0 2 2  Moving slowly or fidgety/restless 2 2 3   Suicidal thoughts 0 1 0  PHQ-9 Score 14 22 18   Difficult doing work/chores Very difficult Very difficult Somewhat difficult     Physical Exam Vitals reviewed.  Constitutional:      Appearance: She is well-developed.  Eyes:     Conjunctiva/sclera: Conjunctivae normal.  Cardiovascular:     Rate and Rhythm: Normal rate and regular rhythm.     Pulses: Normal pulses.     Heart sounds: Normal heart sounds.  Pulmonary:     Effort: Pulmonary effort is normal.     Breath sounds: Normal breath sounds. No wheezing, rhonchi or rales.  Musculoskeletal:     Lumbar back: No swelling, edema, spasms, tenderness or bony tenderness. Normal range of motion. Negative right straight leg raise test and negative left straight leg raise test.     Comments: Full range of motion with flexion, tension, lateral side bends. No bony tenderness. No pain, numbness, tingling elicited with single leg raise bilaterally.   Skin:    General: Skin is warm and dry.  Neurological:     Mental Status: She is alert.     Sensory: No sensory deficit.     Deep Tendon Reflexes:     Reflex Scores:      Patellar reflexes are 2+ on the right side and 2+ on the left side.    Comments: Sensation and strength intact bilateral lower extremities.  Psychiatric:        Speech: Speech normal.        Behavior: Behavior normal.        Thought Content: Thought content normal.

## 2023-02-27 NOTE — Patient Instructions (Addendum)
Please look into Grief Share   [email or web address]   Contact HR regarding counseling ( such Employee Assistance Program , EAP)   Referral to counseling , and also to physical therapy  For back pain, I have prescribed meloxicam which is a strong anti-inflammatory.  To protect your stomach, have also prescribed omeprazole which is used for acid reflux.  I have also given you a muscle relaxant.  Please not take this medication with other pain medications other than meloxicam or alcohol.  Do not drive or operate heavy machinery while on muscle relaxant. Please do not drink alcohol. Only take this medication as needed for acute muscle spasm at bedtime. This medication make you feel drowsy so be very careful.  Stop taking if become too drowsy or somnolent as this puts you at risk for falls. Please contact our office with any questions.    A couple of points in regards to meloxicam ( Mobic) -  This medication is not intended for daily , long term use. It is a potent anti inflammatory ( NSAID), and my intention is for you take as needed for moderate to severe pain. If you find yourself using daily, please let me know.   Please takes Mobic ( meloxicam) with FOOD since it is an anti-inflammatory as it can cause a GI bleed or ulcer. If you have a history of GI bleed or ulcer, please do NOT take.  Do no take over the counter aleve, motrin, advil, goody's powder for pain as they are also NSAIDs, and they are  in the same class as Mobic  Lastly, we will need to monitor kidney function while on Mobic, and if we were to see any decline in kidney function in the future, we would have to discontinue this medication.

## 2023-03-02 ENCOUNTER — Ambulatory Visit: Payer: Medicaid Other | Admitting: Family

## 2023-03-05 ENCOUNTER — Other Ambulatory Visit: Payer: Self-pay | Admitting: Family

## 2023-03-05 DIAGNOSIS — Z8639 Personal history of other endocrine, nutritional and metabolic disease: Secondary | ICD-10-CM

## 2023-03-05 MED ORDER — CHOLECALCIFEROL 1.25 MG (50000 UT) PO TABS
ORAL_TABLET | ORAL | 0 refills | Status: DC
Start: 2023-03-05 — End: 2023-07-12

## 2023-03-06 ENCOUNTER — Telehealth: Payer: Self-pay

## 2023-03-06 ENCOUNTER — Encounter: Payer: Self-pay | Admitting: Family

## 2023-03-06 NOTE — Telephone Encounter (Signed)
-----   Message from Rennie Plowman sent at 03/05/2023  6:44 AM EDT ----- Please order CBC with differential and vitamin D and schedule in 8 weeks

## 2023-03-06 NOTE — Telephone Encounter (Signed)
Unable to leave voicemail due to voicemail box not set up. Pt already has an appt for 04/10/23 labs will be due on 05/01/23

## 2023-03-07 ENCOUNTER — Other Ambulatory Visit: Payer: Self-pay

## 2023-03-07 DIAGNOSIS — R898 Other abnormal findings in specimens from other organs, systems and tissues: Secondary | ICD-10-CM

## 2023-03-07 DIAGNOSIS — Z8639 Personal history of other endocrine, nutritional and metabolic disease: Secondary | ICD-10-CM

## 2023-03-09 NOTE — Telephone Encounter (Signed)
Spoke with pharmacy. Pharmacy stated that it was due to insurance only covering 1 month supply at a time. Refill will be ready for pt when its due. Pt made aware

## 2023-04-01 ENCOUNTER — Ambulatory Visit (HOSPITAL_BASED_OUTPATIENT_CLINIC_OR_DEPARTMENT_OTHER): Payer: Medicaid Other | Admitting: Rehabilitative and Restorative Service Providers"

## 2023-04-01 NOTE — Therapy (Deleted)
OUTPATIENT PHYSICAL THERAPY EVALUATION   Patient Name: Jenny Weber MRN: [id number] DOB:[birth year], 22 y.o., female Today's Date: 04/01/2023  END OF SESSION:   Past Medical History:  Diagnosis Date   Allergy    Diabetes mellitus without complication (HCC)    BORDERLINE   Hypertension    No past surgical history on file. Patient Active Problem List   Diagnosis Date Noted   Low back pain 02/27/2023   Anxiety and depression 10/18/2021   Sore throat 03/19/2021   History of abnormal cervical Pap smear 04/03/2020   Chlamydia 11/12/2019   Yeast vaginitis 11/12/2019   Elevated hemoglobin A1c measurement 11/08/2019   Well female exam with routine gynecological exam 11/08/2019   Vaginal odor 11/08/2019   Fatigue 11/08/2019   Body mass index (BMI) of 50-59.9 in adult (HCC) 11/08/2019   Vaginitis 11/08/2019   Leukocytes in urine 11/08/2019   Vitamin D deficiency 05/01/2018   Labile hypertension 12/17/2015   Migraine without aura and with status migrainosus, not intractable 12/17/2015   Overweight 09/23/2014    PCP: ***  REFERRING PROVIDER: Allegra Grana, FNP   REFERRING DIAG: M54.50 (ICD-10-CM) - Acute bilateral low back pain without sciatica   Rationale for Evaluation and Treatment: Rehabilitation  THERAPY DIAG:  No diagnosis found.  ONSET DATE: ***   SUBJECTIVE:                                                                                                                                                                                           SUBJECTIVE STATEMENT: ***  PERTINENT HISTORY:  ***  PAIN:  Are you having pain? {OPRCPAIN:27236}  PRECAUTIONS:  {Therapy precautions:24002}  RED FLAGS: {PT Red Flags:29287}   WEIGHT BEARING RESTRICTIONS:  {Yes ***/No:24003}  FALLS:  Has patient fallen in last 6 months? {fallsyesno:27318}  LIVING ENVIRONMENT: Lives with: lives with their family Lives in: House/apartment Stairs:  {opstairs:27293} Has following equipment at home: {Assistive devices:23999}  OCCUPATION:  ***  PLOF:  {PLOF:24004}  PATIENT GOALS:  ***  NEXT MD VISIT: ***   OBJECTIVE:  Note: Objective measures were completed at Evaluation unless otherwise noted.  DIAGNOSTIC FINDINGS:  No imaging at this time.   PATIENT SURVEYS:  {rehab surveys:24030}  COGNITIVE STATUS: {cognition:24006}   SENSATION: {sensation:27233}  EDEMA:  {Yes/No:[id number]}  POSTURE:  {posture:25561}    {OPRC PT body part select:29305}    TREATMENT:  DATE: ***     PATIENT EDUCATION:  Education details: *** Person educated: {Person educated:25204} Education method: {Education Method:25205} Education comprehension: {Education Comprehension:25206}  HOME EXERCISE PROGRAM: ***   ASSESSMENT:  CLINICAL IMPRESSION: Patient is a 22 y.o. female who was seen today for physical therapy evaluation and treatment for LBP.Marland Kitchen    OBJECTIVE IMPAIRMENTS: {opptimpairments:25111}.   ACTIVITY LIMITATIONS: {activitylimitations:27494}  PARTICIPATION LIMITATIONS: {participationrestrictions:25113}  PERSONAL FACTORS: {Personal factors:25162} are also affecting patient's functional outcome.   REHAB POTENTIAL: {rehabpotential:25112}  CLINICAL DECISION MAKING: {clinical decision making:25114}  EVALUATION COMPLEXITY: {Evaluation complexity:25115}   GOALS: Goals reviewed with patient? {yes/no:20286}  SHORT TERM GOALS: Target date: ***  *** Baseline: Goal status: INITIAL  2.  *** Baseline:  Goal status: INITIAL  3.  *** Baseline:  Goal status: INITIAL  4.  *** Baseline:  Goal status: INITIAL  5.  *** Baseline:  Goal status: INITIAL  6.  *** Baseline:  Goal status: INITIAL  LONG TERM GOALS: Target date: ***  *** Baseline:  Goal status: INITIAL  2.   *** Baseline:  Goal status: INITIAL  3.  *** Baseline:  Goal status: INITIAL  4.  *** Baseline:  Goal status: INITIAL  5.  *** Baseline:  Goal status: INITIAL  6.  *** Baseline:  Goal status: INITIAL   PLAN:  PT FREQUENCY: {rehab frequency:25116}  PT DURATION: {rehab duration:25117}  PLANNED INTERVENTIONS: {rehab planned interventions:25118::"97110-Therapeutic exercises","97530- Therapeutic [phone]- Neuromuscular re-education","97535- Self JXBJ","47829- Manual therapy"}.  PLAN FOR NEXT SESSION: ***   Luna Fuse, PT 04/01/2023, 8:27 AM

## 2023-04-10 ENCOUNTER — Ambulatory Visit (INDEPENDENT_AMBULATORY_CARE_PROVIDER_SITE_OTHER): Payer: Medicaid Other | Admitting: Family

## 2023-04-10 ENCOUNTER — Other Ambulatory Visit (HOSPITAL_COMMUNITY)
Admission: RE | Admit: 2023-04-10 | Discharge: 2023-04-10 | Disposition: A | Discharge status: Home / Self Care | Payer: Medicaid Other | Source: Ambulatory Visit | Attending: Family | Admitting: Family

## 2023-04-10 ENCOUNTER — Encounter: Payer: Self-pay | Admitting: Family

## 2023-04-10 VITALS — BP 136/76 | HR 78 | Temp 98.1°F | Ht 65.0 in | Wt 321.4 lb

## 2023-04-10 DIAGNOSIS — Z0000 Encounter for general adult medical examination without abnormal findings: Secondary | ICD-10-CM | POA: Diagnosis not present

## 2023-04-10 DIAGNOSIS — F32A Depression, unspecified: Secondary | ICD-10-CM

## 2023-04-10 DIAGNOSIS — Z124 Encounter for screening for malignant neoplasm of cervix: Secondary | ICD-10-CM

## 2023-04-10 DIAGNOSIS — M545 Low back pain, unspecified: Secondary | ICD-10-CM

## 2023-04-10 DIAGNOSIS — Z01419 Encounter for gynecological examination (general) (routine) without abnormal findings: Secondary | ICD-10-CM

## 2023-04-10 DIAGNOSIS — F419 Anxiety disorder, unspecified: Secondary | ICD-10-CM | POA: Diagnosis not present

## 2023-04-10 MED ORDER — DULOXETINE HCL 30 MG PO CPEP
30.0000 mg | ORAL_CAPSULE | Freq: Every day | ORAL | 1 refills | Status: DC
Start: 2023-04-10 — End: 2023-07-11

## 2023-04-10 MED ORDER — MELOXICAM 7.5 MG PO TABS
7.5000 mg | ORAL_TABLET | Freq: Every day | ORAL | 1 refills | Status: DC | PRN
Start: 2023-04-10 — End: 2023-07-12

## 2023-04-10 MED ORDER — OMEPRAZOLE 20 MG PO CPDR
20.0000 mg | DELAYED_RELEASE_CAPSULE | Freq: Every day | ORAL | 1 refills | Status: DC | PRN
Start: 2023-04-10 — End: 2023-09-28

## 2023-04-10 MED ORDER — CYCLOBENZAPRINE HCL 5 MG PO TABS
5.0000 mg | ORAL_TABLET | Freq: Every day | ORAL | 1 refills | Status: DC
Start: 2023-04-10 — End: 2023-07-11

## 2023-04-10 NOTE — Progress Notes (Signed)
Assessment & Plan:  Well female exam with routine gynecological exam Assessment & Plan: Clinical breast exam performed.  Pap smear obtained.   Acute bilateral low back pain without sciatica Assessment & Plan: Unchanged.  I reordered Flexeril, omeprazole, meloxicam to use as needed.  Trial of Cymbalta.  Close follow-up  Orders: -     Cyclobenzaprine HCl; Take 1 tablet (5 mg total) by mouth at bedtime.  Dispense: 30 tablet; Refill: 1 -     Omeprazole; Take 1 capsule (20 mg total) by mouth daily as needed. Take with mobic  Dispense: 30 capsule; Refill: 1 -     Meloxicam; Take 1 tablet (7.5 mg total) by mouth daily as needed for pain.  Dispense: 30 tablet; Refill: 1  Anxiety and depression Assessment & Plan: Uncontrolled.  Anxiety recently increased in setting of lack of transportation and loss of job.  Start Cymbalta 30 mg and titrate.  Referral to virtual counseling.  Close follow-up  Orders: -     DULoxetine HCl; Take 1 capsule (30 mg total) by mouth daily.  Dispense: 90 capsule; Refill: 1 -     Ambulatory referral to Psychology  Screening for cervical cancer -     Cytology - PAP     Return precautions given.   Risks, benefits, and alternatives of the medications and treatment plan prescribed today were discussed, and patient expressed understanding.   Education regarding symptom management and diagnosis given to patient on AVS either electronically or printed.  Return in about 6 weeks (around 05/22/2023).  Rennie Plowman, FNP  Subjective:    Patient ID: Jenny Weber, female    DOB: [birth year], 22 y.o.   MRN: [id number]  CC: Jenny Weber is a 22 y.o. female who presents today for physical exam.    HPI: Her biggest stress is transportation at this time.  She continues to struggle with anxiety.  Trouble sleeping due to anxiety. She doesn't have a car  Her mom drives her and it is hard to rely on her mom all the time.  She is looking for a new job.   She  continues to have mid low back pain.  She never started meloxicam, omeprazole or Flexeril.    She never started Lexapro.    Denies thoughts of self-harm or hurting anyone else  She only completed 4 weeks vitamin D  No family history of breast cancer, colon cancer  Cervical Cancer Screening: due; Pap smear obtained 10/18/2021, [facility] -[place] present        Tetanus - UTD Exercise: no regular exercise.   Alcohol use:  none Smoking/tobacco use: Nonsmoker.    Health Maintenance  Topic Date Due   HPV Vaccine (3 - 3-dose series) 12/21/2015   DTaP/Tdap/Td vaccine (1 - Tdap) Never done   COVID-19 Vaccine (1 - 2023-24 season) Never done   Flu Shot  09/04/2023*   Pap Smear  10/18/2024   Hepatitis C Screening  Completed   HIV Screening  Completed  *Topic was postponed. The date shown is not the original due date.    ALLERGIES: Other and Shellfish allergy  Current Outpatient Medications on File Prior to Visit  Medication Sig Dispense Refill   Cholecalciferol 1.25 MG (50000 UT) TABS 50,000 units PO qwk for 8 weeks. 8 tablet 0   No current facility-administered medications on file prior to visit.    Review of Systems  Constitutional:  Negative for chills, fever and unexpected weight change.  HENT:  Negative for congestion.  Respiratory:  Negative for cough.   Cardiovascular:  Negative for chest pain, palpitations and leg swelling.  Gastrointestinal:  Negative for nausea and vomiting.  Musculoskeletal:  Positive for back pain. Negative for arthralgias and myalgias.  Skin:  Negative for rash.  Neurological:  Negative for headaches.  Hematological:  Negative for adenopathy.  Psychiatric/Behavioral:  Positive for sleep disturbance. Negative for confusion and suicidal ideas. The patient is nervous/anxious.       Objective:    BP 136/76   Pulse 78   Temp 98.1 F (36.7 C) (Oral)   Ht 5\' 5"  (1.651 m)   Wt (!) 321 lb 6.4 oz (145.8 kg)   SpO2 98%   BMI 53.48 kg/m   BP Readings from  Last 3 Encounters:  04/10/23 136/76  02/27/23 126/78  10/18/21 123/78   Wt Readings from Last 3 Encounters:  04/10/23 (!) 321 lb 6.4 oz (145.8 kg)  02/27/23 (!) 322 lb 9.6 oz (146.3 kg)  10/18/21 (!) 303 lb 8 oz (137.7 kg)    Physical Exam Vitals reviewed.  Constitutional:      Appearance: Normal appearance. She is well-developed.  Eyes:     Conjunctiva/sclera: Conjunctivae normal.  Neck:     Thyroid: No thyroid mass or thyromegaly.  Cardiovascular:     Rate and Rhythm: Normal rate and regular rhythm.     Pulses: Normal pulses.     Heart sounds: Normal heart sounds.  Pulmonary:     Effort: Pulmonary effort is normal.     Breath sounds: Normal breath sounds. No wheezing, rhonchi or rales.  Chest:  Breasts:    Breasts are symmetrical.     Right: No inverted nipple, mass, nipple discharge, skin change or tenderness.     Left: No inverted nipple, mass, nipple discharge, skin change or tenderness.  Abdominal:     General: Bowel sounds are normal. There is no distension.     Palpations: Abdomen is soft. Abdomen is not rigid. There is no fluid wave or mass.     Tenderness: There is no abdominal tenderness. There is no guarding or rebound.  Genitourinary:    Cervix: No cervical motion tenderness, discharge or friability.     Uterus: Not enlarged, not fixed and not tender.      Adnexa:        Right: No mass, tenderness or fullness.         Left: No mass, tenderness or fullness.       Comments: Pap performed. No CMT. Unable to appreciated ovaries. Lymphadenopathy:     Head:     Right side of head: No submental, submandibular, tonsillar, preauricular, posterior auricular or occipital adenopathy.     Left side of head: No submental, submandibular, tonsillar, preauricular, posterior auricular or occipital adenopathy.     Cervical:     Right cervical: No superficial, deep or posterior cervical adenopathy.    Left cervical: No superficial, deep or posterior cervical adenopathy.      Upper Body:     Right upper body: No pectoral adenopathy.     Left upper body: No pectoral adenopathy.  Skin:    General: Skin is warm and dry.  Neurological:     Mental Status: She is alert.  Psychiatric:        Speech: Speech normal.        Behavior: Behavior normal.        Thought Content: Thought content normal.

## 2023-04-10 NOTE — Assessment & Plan Note (Signed)
Unchanged.  I reordered Flexeril, omeprazole, meloxicam to use as needed.  Trial of Cymbalta.  Close follow-up

## 2023-04-10 NOTE — Assessment & Plan Note (Signed)
Clinical breast exam performed.  Pap smear obtained.

## 2023-04-10 NOTE — Patient Instructions (Signed)
We discussed starting a daily medication for anxiety.  I have sent in Cymbalta for you to start.  You take this medication every day.   This medication is also used for back pain  I have also printed prescriptions for Flexeril which is a muscle relaxant to be used as needed for back pain.  Do not drive or operate heavy machinery while on muscle relaxant. Please do not drink alcohol. Only take this medication as needed for acute muscle spasm at bedtime. This medication make you feel drowsy so be very careful.  Stop taking if become too drowsy or somnolent as this puts you at risk for falls. Please contact our office with any questions.   I have also provided you with anti-inflammatory similar to ibuprofen called meloxicam ( Mobic).  You may take this medication as needed.  Please take it with food as it can irritate the stomach.  On the days that you take meloxicam, to protect your stomach, please also take Prilosec.  Prilosec is also used for acid reflux.   I have placed a referral for counseling  Let us know if you dont hear back within a week in regards to an appointment being scheduled.   So that you are aware, if you are [facility] MyChart user , please pay attention to your MyChart messages as you may receive a MyChart message with a phone number to call and schedule this test/appointment own your own from our referral coordinator. This is a new process so I do not want you to miss this message.  If you are not a MyChart user, you will receive a phone call.  Health Maintenance, Female Adopting a healthy lifestyle and getting preventive care are important in promoting health and wellness. Ask your health care provider about: The right schedule for you to have regular tests and exams. Things you can do on your own to prevent diseases and keep yourself healthy. What should I know about diet, weight, and exercise? Eat a healthy diet  Eat a diet that includes plenty of vegetables, fruits, low-fat dairy  products, and lean protein. Do not eat a lot of foods that are high in solid fats, added sugars, or sodium. Maintain a healthy weight Body mass index (BMI) is used to identify weight problems. It estimates body fat based on height and weight. Your health care provider can help determine your BMI and help you achieve or maintain a healthy weight. Get regular exercise Get regular exercise. This is one of the most important things you can do for your health. Most adults should: Exercise for at least 150 minutes each week. The exercise should increase your heart rate and make you sweat (moderate-intensity exercise). Do strengthening exercises at least twice a week. This is in addition to the moderate-intensity exercise. Spend less time sitting. Even light physical activity can be beneficial. Watch cholesterol and blood lipids Have your blood tested for lipids and cholesterol at 23 years of age, then have this test every 5 years. Have your cholesterol levels checked more often if: Your lipid or cholesterol levels are high. You are older than 23 years of age. You are at high risk for heart disease. What should I know about cancer screening? Depending on your health history and family history, you may need to have cancer screening at various ages. This may include screening for: Breast cancer. Cervical cancer. Colorectal cancer. Skin cancer. Lung cancer. What should I know about heart disease, diabetes, and high blood pressure? Blood pressure  and heart disease High blood pressure causes heart disease and increases the risk of stroke. This is more likely to develop in people who have high blood pressure readings or are overweight. Have your blood pressure checked: Every 3-5 years if you are 30-67 years of age. Every year if you are 30 years old or older. Diabetes Have regular diabetes screenings. This checks your fasting blood sugar level. Have the screening done: Once every three years after  age 38 if you are at a normal weight and have a low risk for diabetes. More often and at a younger age if you are overweight or have a high risk for diabetes. What should I know about preventing infection? Hepatitis B If you have a higher risk for hepatitis B, you should be screened for this virus. Talk with your health care provider to find out if you are at risk for hepatitis B infection. Hepatitis C Testing is recommended for: Everyone born from [birth year] through 1966. Anyone with known risk factors for hepatitis C. Sexually transmitted infections (STIs) Get screened for STIs, including gonorrhea and chlamydia, if: You are sexually active and are younger than 23 years of age. You are older than 23 years of age and your health care provider tells you that you are at risk for this type of infection. Your sexual activity has changed since you were last screened, and you are at increased risk for chlamydia or gonorrhea. Ask your health care provider if you are at risk. Ask your health care provider about whether you are at high risk for HIV. Your health care provider may recommend a prescription medicine to help prevent HIV infection. If you choose to take medicine to prevent HIV, you should first get tested for HIV. You should then be tested every 3 months for as long as you are taking the medicine. Pregnancy If you are about to stop having your period (premenopausal) and you may become pregnant, seek counseling before you get pregnant. Take 400 to 800 micrograms (mcg) of folic acid every day if you become pregnant. Ask for birth control (contraception) if you want to prevent pregnancy. Osteoporosis and menopause Osteoporosis is a disease in which the bones lose minerals and strength with aging. This can result in bone fractures. If you are 59 years old or older, or if you are at risk for osteoporosis and fractures, ask your health care provider if you should: Be screened for bone loss. Take a  calcium or vitamin D supplement to lower your risk of fractures. Be given hormone replacement therapy (HRT) to treat symptoms of menopause. Follow these instructions at home: Alcohol use Do not drink alcohol if: Your health care provider tells you not to drink. You are pregnant, may be pregnant, or are planning to become pregnant. If you drink alcohol: Limit how much you have to: 0-1 drink a day. Know how much alcohol is in your drink. In the [place] one drink equals one 12 oz bottle of beer (355 mL), one 5 oz glass of wine (148 mL), or one 1 oz glass of hard liquor (44 mL). Lifestyle Do not use any products that contain nicotine or tobacco. These products include cigarettes, chewing tobacco, and vaping devices, such as e-cigarettes. If you need help quitting, ask your health care provider. Do not use street drugs. Do not share needles. Ask your health care provider for help if you need support or information about quitting drugs. General instructions Schedule regular health, dental, and eye exams. Stay current  with your vaccines. Tell your health care provider if: You often feel depressed. You have ever been abused or do not feel safe at home. Summary Adopting a healthy lifestyle and getting preventive care are important in promoting health and wellness. Follow your health care provider's instructions about healthy diet, exercising, and getting tested or screened for diseases. Follow your health care provider's instructions on monitoring your cholesterol and blood pressure. This information is not intended to replace advice given to you by your health care provider. Make sure you discuss any questions you have with your health care provider. Document Revised: 10/12/2020 Document Reviewed: 10/12/2020 Elsevier Patient Education  2024 [employer].

## 2023-04-10 NOTE — Assessment & Plan Note (Signed)
Uncontrolled.  Anxiety recently increased in setting of lack of transportation and loss of job.  Start Cymbalta 30 mg and titrate.  Referral to virtual counseling.  Close follow-up

## 2023-04-18 LAB — CYTOLOGY - PAP
Comment: NEGATIVE
Diagnosis: NEGATIVE
High risk HPV: NEGATIVE

## 2023-04-20 ENCOUNTER — Other Ambulatory Visit: Payer: Self-pay | Admitting: Family

## 2023-04-20 DIAGNOSIS — N760 Acute vaginitis: Secondary | ICD-10-CM

## 2023-04-20 MED ORDER — METRONIDAZOLE 500 MG PO TABS
500.0000 mg | ORAL_TABLET | Freq: Two times a day (BID) | ORAL | 0 refills | Status: DC
Start: 2023-04-20 — End: 2023-05-22

## 2023-05-09 ENCOUNTER — Ambulatory Visit (HOSPITAL_BASED_OUTPATIENT_CLINIC_OR_DEPARTMENT_OTHER): Payer: Medicaid Other | Attending: Family | Admitting: Physical Therapy

## 2023-05-22 ENCOUNTER — Encounter: Payer: Self-pay | Admitting: Family

## 2023-05-22 ENCOUNTER — Telehealth: Payer: Medicaid Other | Admitting: Family

## 2023-05-22 VITALS — Ht 65.0 in | Wt 321.4 lb

## 2023-05-22 DIAGNOSIS — F419 Anxiety disorder, unspecified: Secondary | ICD-10-CM

## 2023-05-22 DIAGNOSIS — F32A Depression, unspecified: Secondary | ICD-10-CM

## 2023-05-22 NOTE — Patient Instructions (Addendum)
   Nice to see you today.    Please start Cymbalta once she can   Essentials for good sleep:   #1 Exercise #2 Limit Caffeine ( no caffeine after lunch) #3 No smart phones, TV prior to bed -- BLUE light is VERY activating and send the brain an 'awake message.'  #4 Go to bed at same time of night each night and get up at same time of day.  #5 Take 0.5 to 5mg  melatonin at 7pm with dinner -this is when natural melatonin will start to increase You may also trial melatonin combination over the counter supplement such as Sleep#3 or Qunol Sleep 5 in 1

## 2023-05-22 NOTE — Assessment & Plan Note (Signed)
Improved overall. PHQ 11.  Reiterated the importance of starting Cymbalta however certainly understand the financial position patient is in at this time.  She will let me know how she is doing.  Will maintain close follow-up

## 2023-05-22 NOTE — Progress Notes (Signed)
Virtual Visit via Video Note  I connected with Jenny Weber on 05/22/23 at  9:00 AM EST by a video enabled telemedicine application and verified that I am speaking with the correct person using two identifiers. Location patient: home Location provider: work  Persons participating in the virtual visit: patient, provider  I discussed the limitations of evaluation and management by telemedicine and the availability of in person appointments. The patient expressed understanding and agreed to proceed.  HPI: Overall feels well today.  She has had some trouble falling asleep of  late.  No regular exercise routine. she is yet to start Cymbalta as she was awaiting employment.  Back pain is not as bothersome at this time.  ROS: See pertinent positives and negatives per HPI.  EXAM:  VITALS per patient if applicable: Ht 5\' 5"  (1.651 m)   Wt (!) 321 lb 6.4 oz (145.8 kg)   BMI 53.48 kg/m  BP Readings from Last 3 Encounters:  04/10/23 136/76  02/27/23 126/78  10/18/21 123/78   Wt Readings from Last 3 Encounters:  05/22/23 (!) 321 lb 6.4 oz (145.8 kg)  04/10/23 (!) 321 lb 6.4 oz (145.8 kg)  02/27/23 (!) 322 lb 9.6 oz (146.3 kg)      05/22/2023    8:54 AM 04/10/2023    9:51 AM 02/27/2023    8:17 AM  Depression screen PHQ 2/9  Decreased Interest 1 2 1   Down, Depressed, Hopeless 0 3 1  PHQ - 2 Score 1 5 2   Altered sleeping 3 3 3   Tired, decreased energy 3 3 3   Change in appetite 1 3 2   Feeling bad or failure about yourself  2 3 2   Trouble concentrating 0 2 0  Moving slowly or fidgety/restless 1 3 2   Suicidal thoughts 0 0 0  PHQ-9 Score 11 22 14   Difficult doing work/chores Somewhat difficult Very difficult Very difficult    GENERAL: alert, oriented, appears well and in no acute distress  HEENT: atraumatic, conjunttiva clear, no obvious abnormalities on inspection of external nose and ears  NECK: normal movements of the head and neck  LUNGS: on inspection no signs of  respiratory distress, breathing rate appears normal, no obvious gross SOB, gasping or wheezing  CV: no obvious cyanosis  MS: moves all visible extremities without noticeable abnormality  PSYCH/NEURO: pleasant and cooperative, no obvious depression or anxiety, speech and thought processing grossly intact  ASSESSMENT AND PLAN: Anxiety and depression Assessment & Plan: Improved overall. PHQ 11.  Reiterated the importance of starting Cymbalta however certainly understand the financial position patient is in at this time.  She will let me know how she is doing.  Will maintain close follow-up      -we discussed possible serious and likely etiologies, options for evaluation and workup, limitations of telemedicine visit vs in person visit, treatment, treatment risks and precautions. Pt prefers to treat via telemedicine empirically rather then risking or undertaking an in person visit at this moment.    I discussed the assessment and treatment plan with the patient. The patient was provided an opportunity to ask questions and all were answered. The patient agreed with the plan and demonstrated an understanding of the instructions.   The patient was advised to call back or seek an in-person evaluation if the symptoms worsen or if the condition fails to improve as anticipated.  Advised if desired AVS can be mailed or viewed via MyChart if Mychart user.   Rennie Plowman, FNP

## 2023-06-28 ENCOUNTER — Ambulatory Visit: Payer: Medicaid Other | Admitting: Nurse Practitioner

## 2023-07-10 ENCOUNTER — Ambulatory Visit: Payer: Medicaid Other | Admitting: Family

## 2023-07-11 ENCOUNTER — Ambulatory Visit (INDEPENDENT_AMBULATORY_CARE_PROVIDER_SITE_OTHER): Payer: Medicaid Other | Admitting: Family

## 2023-07-11 ENCOUNTER — Encounter: Payer: Self-pay | Admitting: Family

## 2023-07-11 VITALS — BP 130/70 | HR 85 | Temp 99.0°F | Ht 65.0 in | Wt 332.4 lb

## 2023-07-11 DIAGNOSIS — M546 Pain in thoracic spine: Secondary | ICD-10-CM | POA: Diagnosis not present

## 2023-07-11 DIAGNOSIS — F419 Anxiety disorder, unspecified: Secondary | ICD-10-CM

## 2023-07-11 DIAGNOSIS — M545 Low back pain, unspecified: Secondary | ICD-10-CM

## 2023-07-11 DIAGNOSIS — F32A Depression, unspecified: Secondary | ICD-10-CM

## 2023-07-11 MED ORDER — NAPROXEN SODIUM 550 MG PO TABS: ORAL_TABLET | ORAL | 1 refills | Status: DC

## 2023-07-11 MED ORDER — TIZANIDINE HCL 2 MG PO CAPS
2.0000 mg | ORAL_CAPSULE | Freq: Three times a day (TID) | ORAL | 1 refills | Status: AC
Start: 2023-07-11 — End: ?

## 2023-07-11 MED ORDER — FLUOXETINE HCL 20 MG PO CAPS: 20.0000 mg | ORAL_CAPSULE | Freq: Every morning | ORAL | 3 refills | Status: DC

## 2023-07-11 NOTE — Progress Notes (Signed)
 Assessment & Plan:  Anxiety and depression Assessment & Plan: Uncontrolled. Patient never started Cymbalta  due to concern for cost.  Reviewed [facility] $4 list with patient today, opted to start Prozac  20 mg and titrate.  Orders: -     FLUoxetine  HCl; Take 1 capsule (20 mg total) by mouth every morning.  Dispense: 90 capsule; Refill: 3  Acute bilateral low back pain without sciatica Assessment & Plan: Presentation consistent with muscular spasm after injury.  Start tizanidine , naproxen .  Printed exercises for patient.  Close follow-up  Orders: -     tiZANidine  HCl; Take 1 capsule (2 mg total) by mouth every 8 (eight) hours.  Dispense: 30 capsule; Refill: 1 -     Naproxen  Sodium; Take at earliest onset of headache. May repeat dose in 12 hours if needed. Do not exceed two tablets in 24 hours. Take with food  Dispense: 30 tablet; Refill: 1  Acute midline thoracic back pain Assessment & Plan: Presentation consistent with muscular spasm after injury.  Start tizanidine , naproxen .  Printed exercises for patient.  Close follow-up      Return precautions given.   Risks, benefits, and alternatives of the medications and treatment plan prescribed today were discussed, and patient expressed understanding.   Education regarding symptom management and diagnosis given to patient on AVS either electronically or printed.  Return in about 2 weeks (around 07/25/2023).  Rollene Northern, FNP  Subjective:    Patient ID: Jenny Weber, Jenny Weber    DOB: 05/24/2001, 23 y.o.   MRN: [id number]  CC: BROOK GERACI is a 23 y.o. Jenny Weber who presents today for an acute visit.    HPI: Complains of upper back , neck pain x 2 weeks ago after injury at work.  Pain is constant. Pain is worse at night when trying to sleep.   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   She slipped and hit latch of truck on upper back.   Massage causes pain to  worse. She is taking tylenol  ( unsure of dose) without relief.    Denies numbness, upper arm pain, dysuria.   LMP 07/03/23      She never started Cymbalta  or any medications due to cost  smoker Allergies: Other and Shellfish allergy Current Outpatient Medications on File Prior to Visit  Medication Sig Dispense Refill   omeprazole  (PRILOSEC) 20 MG capsule Take 1 capsule (20 mg total) by mouth daily as needed. Take with mobic  (Patient not taking: Reported on 07/11/2023) 30 capsule 1   No current facility-administered medications on file prior to visit.    Review of Systems  Constitutional:  Negative for chills and fever.  Respiratory:  Negative for cough.   Cardiovascular:  Negative for chest pain and palpitations.  Gastrointestinal:  Negative for nausea and vomiting.  Musculoskeletal:  Positive for back pain.  Neurological:  Negative for dizziness and numbness.  Psychiatric/Behavioral:  Positive for sleep disturbance. Negative for suicidal ideas.       Objective:    BP 130/70   Pulse 85   Temp 99 F (37.2 C) (Oral)   Ht 5' 5 (1.651 m)   Wt (!) 332 lb 6.4 oz (150.8 kg)   LMP 07/03/2023 (Exact Date)   SpO2 97%   BMI 55.31 kg/m   BP Readings from Last 3 Encounters:  07/11/23 130/70  04/10/23 136/76  02/27/23 126/78   Wt Readings from Last 3 Encounters:  07/11/23 ROLLEN)  332 lb 6.4 oz (150.8 kg)  05/22/23 (!) 321 lb 6.4 oz (145.8 kg)  04/10/23 (!) 321 lb 6.4 oz (145.8 kg)      07/11/2023   10:17 AM 05/22/2023    8:54 AM 04/10/2023    9:51 AM  Depression screen PHQ 2/9  Decreased Interest 1 1 2   Down, Depressed, Hopeless 2 0 3  PHQ - 2 Score 3 1 5   Altered sleeping 3 3 3   Tired, decreased energy 3 3 3   Change in appetite 3 1 3   Feeling bad or failure about yourself  2 2 3   Trouble concentrating 1 0 2  Moving slowly or fidgety/restless 3 1 3   Suicidal thoughts 0 0 0  PHQ-9 Score 18 11 22   Difficult doing work/chores Somewhat difficult Somewhat difficult Very  difficult     Physical Exam Vitals reviewed.  Constitutional:      Appearance: She is well-developed.  Eyes:     Conjunctiva/sclera: Conjunctivae normal.  Cardiovascular:     Rate and Rhythm: Normal rate and regular rhythm.     Pulses: Normal pulses.     Heart sounds: Normal heart sounds.  Pulmonary:     Effort: Pulmonary effort is normal.     Breath sounds: Normal breath sounds. No wheezing, rhonchi or rales.  Musculoskeletal:     Cervical back: Spasms and tenderness present. No bony tenderness or crepitus.     Thoracic back: Spasms present. No tenderness or bony tenderness. Normal range of motion. No scoliosis.     Lumbar back: No tenderness or bony tenderness.       Back:     Comments: Muscular spasm over trapezius. No rash, edema.   Skin:    General: Skin is warm and dry.  Neurological:     Mental Status: She is alert.  Psychiatric:        Speech: Speech normal.        Behavior: Behavior normal.        Thought Content: Thought content normal.

## 2023-07-11 NOTE — Patient Instructions (Addendum)
 Start prozac  for anxiety and depression; this is an everyday medication.   Cervical Strain and Sprain Rehab Ask your health care provider which exercises are safe for you. Do exercises exactly as told by your health care provider and adjust them as directed. It is normal to feel mild stretching, pulling, tightness, or discomfort as you do these exercises. Stop right away if you feel sudden pain or your pain gets worse. Do not begin these exercises until told by your health care provider. Stretching and range-of-motion exercises Cervical side bending  Using good posture, sit on a stable chair or stand up. Without moving your shoulders, slowly tilt your left / right ear to your shoulder until you feel a stretch in the neck muscles on the opposite side. You should be looking straight ahead. Hold for __________ seconds. Repeat with the other side of your neck. Repeat __________ times. Complete this exercise __________ times a day. Cervical rotation  Using good posture, sit on a stable chair or stand up. Slowly turn your head to the side as if you are looking over your left / right shoulder. Keep your eyes level with the ground. Stop when you feel a stretch along the side and the back of your neck. Hold for __________ seconds. Repeat this by turning to your other side. Repeat __________ times. Complete this exercise __________ times a day. Thoracic extension and pectoral stretch  Roll a towel or a small blanket so it is about 4 inches (10 cm) in diameter. Lie down on your back on a firm surface. Put the towel in the middle of your back across your spine. It should not be under your shoulder blades. Put your hands behind your head and let your elbows fall out to your sides. Hold for __________ seconds. Repeat __________ times. Complete this exercise __________ times a day. Strengthening exercises Upper cervical flexion  Lie on your back with a thin pillow behind your head or a small,  rolled-up towel under your neck. Gently tuck your chin toward your chest and nod your head down to look toward your feet. Do not lift your head off the pillow. Hold for __________ seconds. Release the tension slowly. Relax your neck muscles completely before you repeat this exercise. Repeat __________ times. Complete this exercise __________ times a day. Cervical extension  Stand about 6 inches (15 cm) away from a wall, with your back facing the wall. Place a soft object, about 6-8 inches (15-20 cm) in diameter, between the back of your head and the wall. A soft object could be a small pillow, a ball, or a folded towel. Gently tilt your head back and press into the soft object. Keep your jaw and forehead relaxed. Hold for __________ seconds. Release the tension slowly. Relax your neck muscles completely before you repeat this exercise. Repeat __________ times. Complete this exercise __________ times a day. Posture and body mechanics Body mechanics refer to the movements and positions of your body while you do your daily activities. Posture is part of body mechanics. Good posture and healthy body mechanics can help to relieve stress in your body's tissues and joints. Good posture means that your spine is in its natural S-curve position (your spine is neutral), your shoulders are pulled back slightly, and your head is not tipped forward. The following are general guidelines for using improved posture and body mechanics in your everyday activities. Sitting  When sitting, keep your spine neutral and keep your feet flat on the floor. Use a  footrest, if needed, and keep your thighs parallel to the floor. Avoid rounding your shoulders. Avoid tilting your head forward. When working at a desk or a computer, keep your desk at a height where your hands are slightly lower than your elbows. Slide your chair under your desk so you are close enough to maintain good posture. When working at a computer, place  your monitor at a height where you are looking straight ahead and you do not have to tilt your head forward or downward to look at the screen. Standing  When standing, keep your spine neutral and keep your feet about hip-width apart. Keep a slight bend in your knees. Your ears, shoulders, and hips should line up. When you do a task in which you stand in one place for a long time, place one foot up on a stable object that is 2-4 inches (5-10 cm) high, such as a footstool. This helps keep your spine neutral. Resting When lying down and resting, avoid positions that are most painful for you. Try to support your neck in a neutral position. You can use a contour pillow or a small rolled-up towel. Your pillow should support your neck but not push on it. This information is not intended to replace advice given to you by your health care provider. Make sure you discuss any questions you have with your health care provider. Document Revised: 09/26/2022 Document Reviewed: 12/13/2021 Elsevier Patient Education  2024 [employer]Thoracic Strain Rehab Ask your health care provider which exercises are safe for you. Do exercises exactly as told by your provider and adjust them as directed. It is normal to feel mild stretching, pulling, tightness, or discomfort as you do these exercises. Stop right away if you feel sudden pain or your pain gets worse. Do not begin these exercises until told by your provider. Stretching and range-of-motion exercise This exercise warms up your muscles and joints and improves the movement and flexibility of your back and shoulders. This exercise also helps to relieve pain. Chest and spine stretch  Lie down on your back on a firm surface. Roll a towel or a small blanket so it is about 4 inches (10 cm) in diameter. Put the towel under the middle of your back so it is under your spine, but not under your shoulder blades. Put your hands behind your head and let your elbows fall to your  sides. This will increase your stretch. Take a deep breath (inhale). Hold for __________ seconds. Relax after you breathe out (exhale). Repeat __________ times. Complete this exercise __________ times a day. Strengthening exercises These exercises build strength and endurance in your back and your shoulder blade muscles. Endurance is the ability to use your muscles for a long time, even after they get tired. Alternating arm and leg raises  Get on your hands and knees on a firm surface. If you are on a hard floor, you may want to use padding, such as an exercise mat, to cushion your knees. Line up your arms and legs. Your hands should be directly below your shoulders, and your knees should be directly below your hips. Lift your left leg behind you. At the same time, raise your right arm and straighten it in front of you. Do not lift your leg higher than your hip. Do not lift your arm higher than your shoulder. Keep your abdominal and back muscles tight. Keep your hips facing the ground. Do not arch your back. Carefully stay balanced. Do not hold  your breath. Hold for __________ seconds. Slowly return to the starting position and repeat with your right leg and your left arm. Repeat __________ times. Complete this exercise __________ times a day. Straight arm rows This exercise is also called the shoulder extension exercise. Stand with your feet shoulder width apart. Secure an exercise band to a stable object in front of you so the band is at or above shoulder height. Hold one end of the exercise band in each hand. Straighten your elbows and lift your hands up to shoulder height. Step back, away from the secured end of the exercise band, until the band stretches. Squeeze your shoulder blades together and pull your hands down to the sides of your thighs. Stop when your hands are straight down by your sides. This is shoulder extension. Do not let your hands go behind your body. Hold for  __________ seconds. Slowly return to the starting position. Repeat __________ times. Complete this exercise __________ times a day. Rowing scapular retraction This is an exercise in which the shoulder blades (scapulae) are pulled toward each other (retraction). Sit in a stable chair without armrests, or stand up. Secure an exercise band to a stable object in front of you so the band is at shoulder height. Hold one end of the exercise band in each hand. Your palms should face toward each other. Bring your arms out straight in front of you. Step back, away from the secured end of the exercise band, until the band stretches. Pull the band backward. As you do this, bend your elbows and squeeze your shoulder blades together, but avoid letting the rest of your body move. Do not shrug your shoulders upward while you do this. Stop when your elbows are at your sides or slightly behind your body. Hold for __________ seconds. Slowly straighten your arms to return to the starting position. Repeat __________ times. Complete this exercise __________ times a day. Posture and body mechanics Good posture and healthy body mechanics can help to relieve stress in your body's tissues and joints. Body mechanics refers to the movements and positions of your body while you do your daily activities. Posture is part of body mechanics. Good posture means: Your spine is in its natural S-curve position (neutral). Your shoulders are pulled back slightly. Your head is not tipped forward. Follow these guidelines to improve your posture and body mechanics in your everyday activities. Standing  When standing, keep your spine neutral and your feet about hip width apart. Keep a slight bend in your knees. Your ears, shoulders, and hips should line up with each other. When you do a task in which you lean forward while standing in one place for a long time, place one foot up on a stable object that is 2-4 inches (5-10 cm) high,  such as a footstool. This helps keep your spine neutral. Sitting  When sitting, keep your spine neutral and keep your feet flat on the floor. Use a footrest if needed. Keep your thighs parallel to the floor. Avoid rounding your shoulders, and avoid tilting your head forward. When working at a desk or a computer, keep your desk at a height where your hands are slightly lower than your elbows. Slide your chair under your desk so you are close enough to maintain good posture. When working at a computer, place your monitor at a height where you are looking straight ahead and you do not have to tilt your head forward or downward to look at the screen.  Resting When lying down and resting, avoid positions that are most painful for you. If you have pain with activities such as sitting, bending, stooping, or squatting (flexion-basedactivities), lie in a position in which your body does not bend very much. For example, avoid curling up on your side with your arms and knees near your chest (fetal position). If you have pain with activities such as standing for a long time or reaching with your arms (extension-basedactivities), lie with your spine in a neutral position and bend your knees slightly. Try the following positions: Lie on your side with a pillow between your knees. Lie on your back with a pillow under your knees.  Lifting  When lifting objects, keep your feet at least shoulder width apart and tighten your abdominal muscles. Bend your knees and hips and keep your spine neutral. It is important to lift using the strength of your legs, not your back. Do not lock your knees straight out. Always ask for help to lift heavy or awkward objects. This information is not intended to replace advice given to you by your health care provider. Make sure you discuss any questions you have with your health care provider. Document Revised: 11/04/2022 Document Reviewed: 01/10/2022 Elsevier Patient Education  2024  [employer].

## 2023-07-12 NOTE — Assessment & Plan Note (Signed)
 Uncontrolled. Patient never started Cymbalta  due to concern for cost.  Reviewed [facility] $4 list with patient today, opted to start Prozac  20 mg and titrate.

## 2023-07-12 NOTE — Assessment & Plan Note (Signed)
 Presentation consistent with muscular spasm after injury.  Start tizanidine , naproxen .  Printed exercises for patient.  Close follow-up

## 2023-07-27 ENCOUNTER — Ambulatory Visit: Payer: Medicaid Other | Admitting: Family

## 2023-08-01 ENCOUNTER — Ambulatory Visit (INDEPENDENT_AMBULATORY_CARE_PROVIDER_SITE_OTHER): Payer: Medicaid Other | Admitting: Family

## 2023-08-01 ENCOUNTER — Encounter: Payer: Self-pay | Admitting: Family

## 2023-08-01 VITALS — BP 132/72 | HR 84 | Temp 97.9°F | Ht 65.0 in | Wt 335.4 lb

## 2023-08-01 DIAGNOSIS — M546 Pain in thoracic spine: Secondary | ICD-10-CM | POA: Diagnosis not present

## 2023-08-01 DIAGNOSIS — F419 Anxiety disorder, unspecified: Secondary | ICD-10-CM | POA: Diagnosis not present

## 2023-08-01 DIAGNOSIS — F32A Depression, unspecified: Secondary | ICD-10-CM

## 2023-08-01 DIAGNOSIS — R5383 Other fatigue: Secondary | ICD-10-CM | POA: Diagnosis not present

## 2023-08-01 LAB — POCT URINE PREGNANCY
Preg Test, Ur: NEGATIVE
Preg Test, Ur: NEGATIVE

## 2023-08-01 NOTE — Patient Instructions (Signed)
 Please asked to use done at [facility].  There is no appointment needed.  You may do this in the next couple of weeks.  I placed a referral to pulmonology and also to a counselor  Let us know if you dont hear back within a week in regards to an appointment being scheduled.   So that you are aware, if you are [facility] MyChart user , please pay attention to your MyChart messages as you may receive a MyChart message with a phone number to call and schedule this test/appointment own your own from our referral coordinator. This is a new process so I do not want you to miss this message.  If you are not a MyChart user, you will receive a phone call.

## 2023-08-01 NOTE — Progress Notes (Unsigned)
   Assessment & Plan:  There are no diagnoses linked to this encounter.   Return precautions given.   Risks, benefits, and alternatives of the medications and treatment plan prescribed today were discussed, and patient expressed understanding.   Education regarding symptom management and diagnosis given to patient on AVS either electronically or printed.  No follow-ups on file.  Rennie Plowman, FNP  Subjective:    Patient ID: Jenny Weber, female    DOB: 10-Aug-2000, 23 y.o.   MRN: [id number]  CC: Jenny Weber is a 23 y.o. female who presents today for follow up.   HPI: She describes of low back pain , intensity can wane.   Worse when standing, stairs.   No pain with walking. No associated numbness, hematuria.    She improves with rest and home exercises. Pain is better in the morning, and worsens as day progresses.   She is concerned in regards to snoring at night.           Started prozac 20mg  2 days ago   LMP 'end of January'    Allergies: Other and Shellfish allergy Current Outpatient Medications on File Prior to Visit  Medication Sig Dispense Refill   FLUoxetine (PROZAC) 20 MG capsule Take 1 capsule (20 mg total) by mouth every morning. 90 capsule 3   naproxen sodium (ANAPROX) 550 MG tablet Take at earliest onset of headache. May repeat dose in 12 hours if needed. Do not exceed two tablets in 24 hours. Take with food 30 tablet 1   tizanidine (ZANAFLEX) 2 MG capsule Take 1 capsule (2 mg total) by mouth every 8 (eight) hours. 30 capsule 1   omeprazole (PRILOSEC) 20 MG capsule Take 1 capsule (20 mg total) by mouth daily as needed. Take with mobic (Patient not taking: Reported on 05/22/2023) 30 capsule 1   No current facility-administered medications on file prior to visit.    Review of Systems    Objective:    BP 132/72   Pulse 84   Temp 97.9 F (36.6 C) (Oral)   Ht 5\' 5"  (1.651 m)   Wt (!) 335 lb 6.4 oz (152.1 kg)   LMP 07/03/2023 (Exact  Date)   SpO2 98%   BMI 55.81 kg/m  BP Readings from Last 3 Encounters:  08/01/23 132/72  07/11/23 130/70  04/10/23 136/76   Wt Readings from Last 3 Encounters:  08/01/23 (!) 335 lb 6.4 oz (152.1 kg)  07/11/23 (!) 332 lb 6.4 oz (150.8 kg)  05/22/23 (!) 321 lb 6.4 oz (145.8 kg)    Physical Exam

## 2023-08-02 NOTE — Assessment & Plan Note (Addendum)
 Chronic, suboptimal control.  Advised to start naproxen, tizanidine as previously prescribed. pending x-ray.  Plan to refer to PT if symptoms persist.

## 2023-08-02 NOTE — Assessment & Plan Note (Addendum)
 Chronic, stable.  Started Prozac 20 mg 2 days ago.  Referral to counseling. will follow.

## 2023-08-17 ENCOUNTER — Encounter: Payer: Self-pay | Admitting: Sleep Medicine

## 2023-09-28 ENCOUNTER — Telehealth (INDEPENDENT_AMBULATORY_CARE_PROVIDER_SITE_OTHER): Admitting: Family

## 2023-09-28 ENCOUNTER — Encounter: Payer: Self-pay | Admitting: Family

## 2023-09-28 VITALS — Ht 65.0 in | Wt 335.4 lb

## 2023-09-28 DIAGNOSIS — M546 Pain in thoracic spine: Secondary | ICD-10-CM

## 2023-09-28 DIAGNOSIS — F419 Anxiety disorder, unspecified: Secondary | ICD-10-CM

## 2023-09-28 DIAGNOSIS — F32A Depression, unspecified: Secondary | ICD-10-CM

## 2023-09-28 MED ORDER — GABAPENTIN 100 MG PO CAPS: 200.0000 mg | ORAL_CAPSULE | Freq: Every day | ORAL | 3 refills | Status: AC

## 2023-09-28 MED ORDER — FLUOXETINE HCL 40 MG PO CAPS
40.0000 mg | ORAL_CAPSULE | Freq: Every morning | ORAL | 3 refills | Status: AC
Start: 2023-09-28 — End: ?

## 2023-09-28 NOTE — Assessment & Plan Note (Addendum)
 Chronic, suboptimal control.  Most bothersome at night.  Start gabapentin  200 mg.Counseled on sedation and avoidance of alcohol.  Continue tizanidine  2 mg as needed

## 2023-09-28 NOTE — Assessment & Plan Note (Signed)
 Chronic, improved.  Increase Prozac  to 40 mg daily.

## 2023-09-28 NOTE — Progress Notes (Signed)
 Virtual Visit via Video Note  I connected with Jenny Weber on 09/28/23 at 10:30 AM EDT by a video enabled telemedicine application and verified that I am speaking with the correct person using two identifiers. Location patient: home Location provider: work  Persons participating in the virtual visit: patient, provider  I discussed the limitations of evaluation and management by telemedicine and the availability of in person appointments. The patient expressed understanding and agreed to proceed.  HPI: Feels well today. No new complaints.  Boyfriend and mother have noticed an improvement in mood.  Compliant with prozac  20mg . She is interested increasing. She can feel 'a  difference' with prozac  versus cymbalta  previously.  She has trouble staying asleep d/t back pain.  She tried gabapentin  200mg  and she was fell asleep right away as back pain resolved. She has trouble getting comfortable at night d/y back pain.  She is interested in gabapentin . Naprosyn  was not helpful for back pain, and she hasn't been taking.   Denies SI/HI   No alcohol use.   ROS: See pertinent positives and negatives per HPI.  EXAM:  VITALS per patient if applicable: Ht 5\' 5"  (1.651 m)   Wt (!) 335 lb 6.4 oz (152.1 kg)   LMP  (LMP Unknown)   BMI 55.81 kg/m  BP Readings from Last 3 Encounters:  08/01/23 132/72  07/11/23 130/70  04/10/23 136/76   Wt Readings from Last 3 Encounters:  09/28/23 (!) 335 lb 6.4 oz (152.1 kg)  08/01/23 (!) 335 lb 6.4 oz (152.1 kg)  07/11/23 (!) 332 lb 6.4 oz (150.8 kg)      09/28/2023   10:28 AM 08/01/2023    9:02 AM 08/01/2023    8:54 AM  Depression screen PHQ 2/9  Decreased Interest 0 1 0  Down, Depressed, Hopeless 0 2 0  PHQ - 2 Score 0 3 0  Altered sleeping 3 3 0  Tired, decreased energy 3 3 0  Change in appetite 2 2 0  Feeling bad or failure about yourself  0 2 0  Trouble concentrating 0 1 0  Moving slowly or fidgety/restless 3 1 0  Suicidal thoughts 0 0 0   PHQ-9 Score 11 15 0  Difficult doing work/chores Somewhat difficult Very difficult Not difficult at all    GENERAL: alert, oriented, appears well and in no acute distress  HEENT: atraumatic, conjunttiva clear, no obvious abnormalities on inspection of external nose and ears  NECK: normal movements of the head and neck  LUNGS: on inspection no signs of respiratory distress, breathing rate appears normal, no obvious gross SOB, gasping or wheezing  CV: no obvious cyanosis  MS: moves all visible extremities without noticeable abnormality  PSYCH/NEURO: pleasant and cooperative, no obvious depression or anxiety, speech and thought processing grossly intact  ASSESSMENT AND PLAN: Anxiety and depression Assessment & Plan: Chronic, improved.  Increase Prozac  to 40 mg daily.  Orders: -     FLUoxetine  HCl; Take 1 capsule (40 mg total) by mouth every morning.  Dispense: 90 capsule; Refill: 3  Acute midline thoracic back pain Assessment & Plan: Chronic, suboptimal control.  Most bothersome at night.  Start gabapentin  200 mg.Counseled on sedation and avoidance of alcohol.  Continue tizanidine  2 mg as needed  Orders: -     Gabapentin ; Take 2 capsules (200 mg total) by mouth at bedtime.  Dispense: 180 capsule; Refill: 3     -we discussed possible serious and likely etiologies, options for evaluation and workup, limitations of telemedicine  visit vs in person visit, treatment, treatment risks and precautions. Pt prefers to treat via telemedicine empirically rather then risking or undertaking an in person visit at this moment.    I discussed the assessment and treatment plan with the patient. The patient was provided an opportunity to ask questions and all were answered. The patient agreed with the plan and demonstrated an understanding of the instructions.   The patient was advised to call back or seek an in-person evaluation if the symptoms worsen or if the condition fails to improve as  anticipated.  Advised if desired AVS can be mailed or viewed via MyChart if Mychart user.   Bascom Bossier, FNP

## 2023-10-05 ENCOUNTER — Telehealth: Admitting: Family

## 2023-11-21 ENCOUNTER — Ambulatory Visit (INDEPENDENT_AMBULATORY_CARE_PROVIDER_SITE_OTHER): Admitting: Psychology

## 2023-11-21 DIAGNOSIS — F419 Anxiety disorder, unspecified: Secondary | ICD-10-CM | POA: Diagnosis not present

## 2023-11-21 DIAGNOSIS — F331 Major depressive disorder, recurrent, moderate: Secondary | ICD-10-CM | POA: Diagnosis not present

## 2023-11-21 NOTE — Progress Notes (Signed)
   Forde Radon, [facility]
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
## 2023-11-22 ENCOUNTER — Encounter: Payer: Self-pay | Admitting: Psychology

## 2023-12-01 IMAGING — US US THYROID
1 series · 14 of 25 positions shown · non-contrast
Comparison: Chest XR, 03/15/2018.

CLINICAL DATA: Goiter.

EXAM:
THYROID ULTRASOUND
TECHNIQUE: Ultrasound examination of the thyroid gland and adjacent soft
tissues was performed.

[Series 1: us thyroid · 0.06mm/px · 14 of 58 slices shown]
[im 1/58]
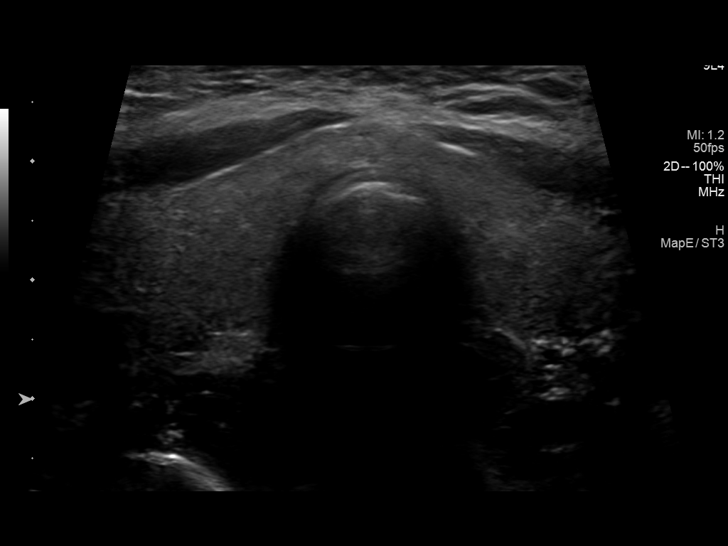
[im 5/58]
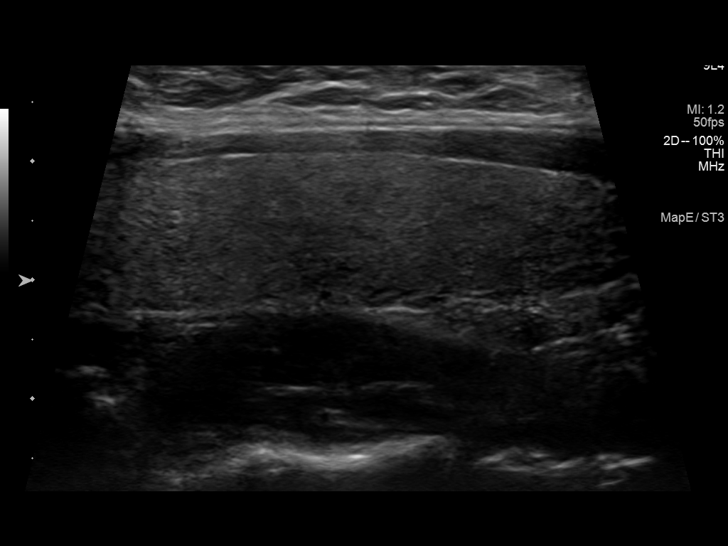
[im 10/58]
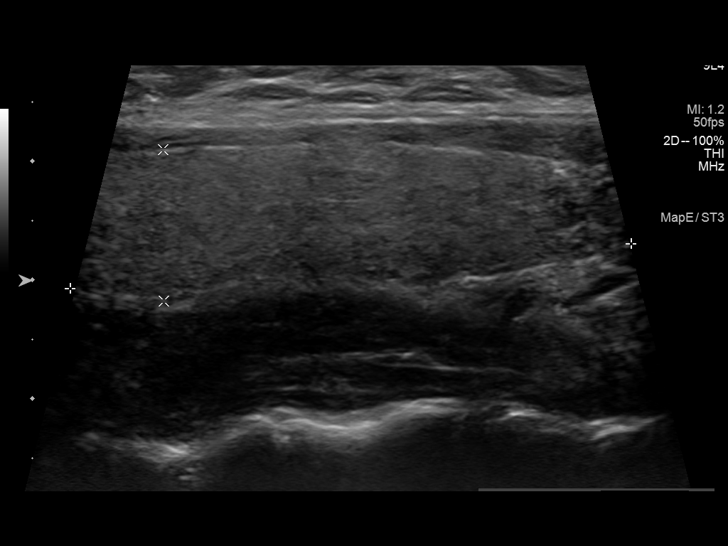
[im 15/58]
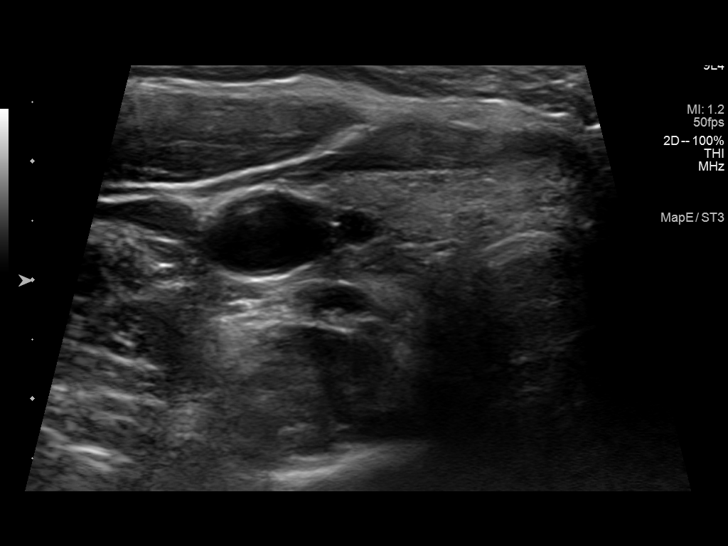
[im 20/58]
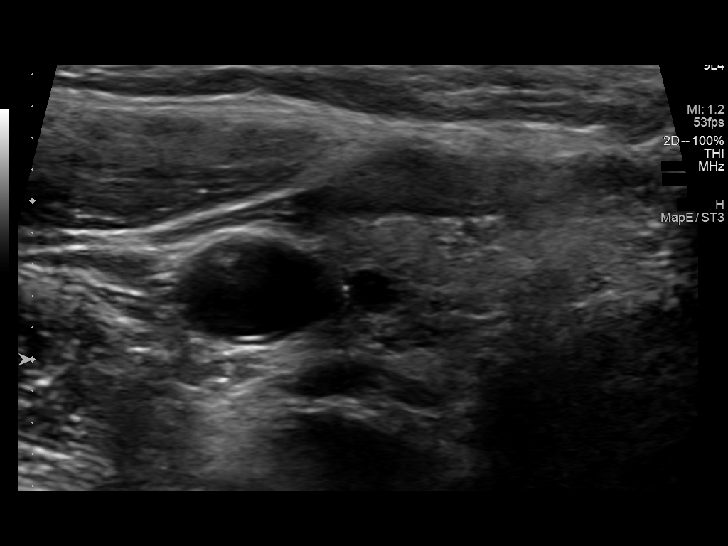
[im 22/58]
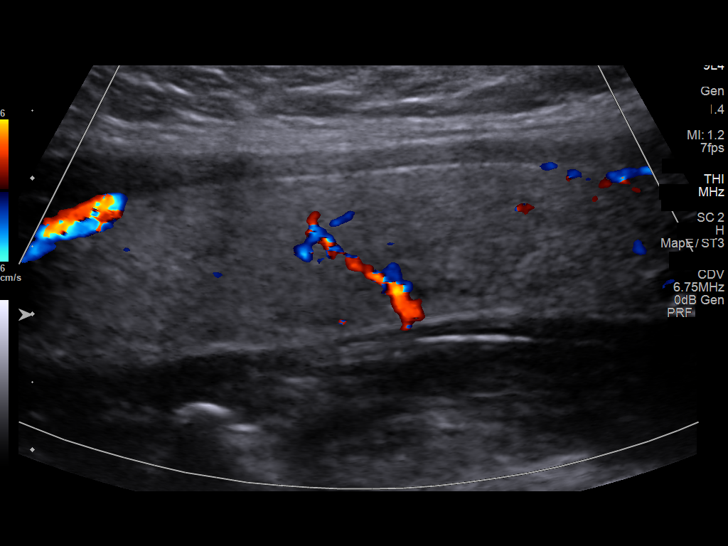
[im 27/58]
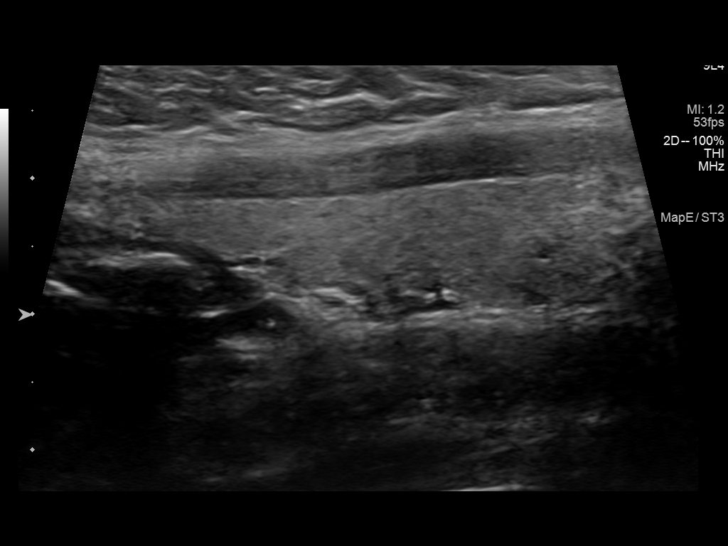
[im 31/58]
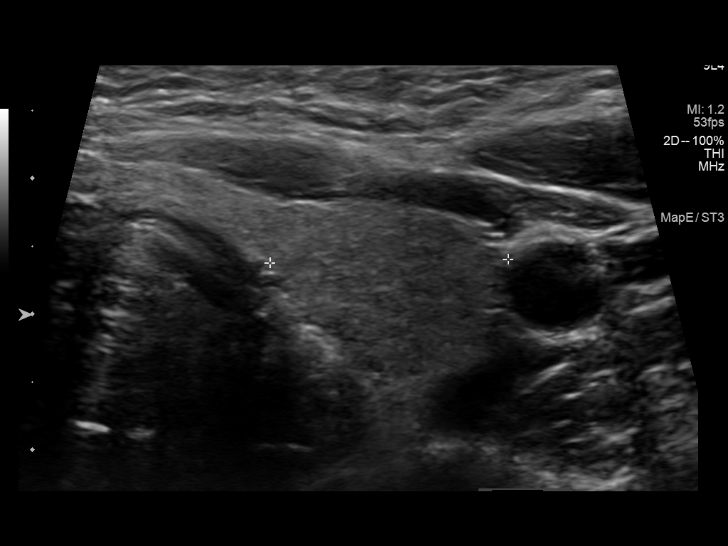
[im 36/58]
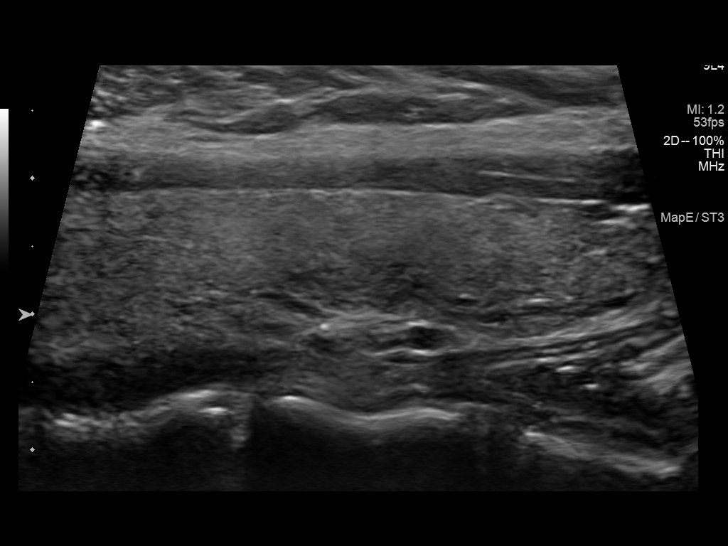
[im 39/58]
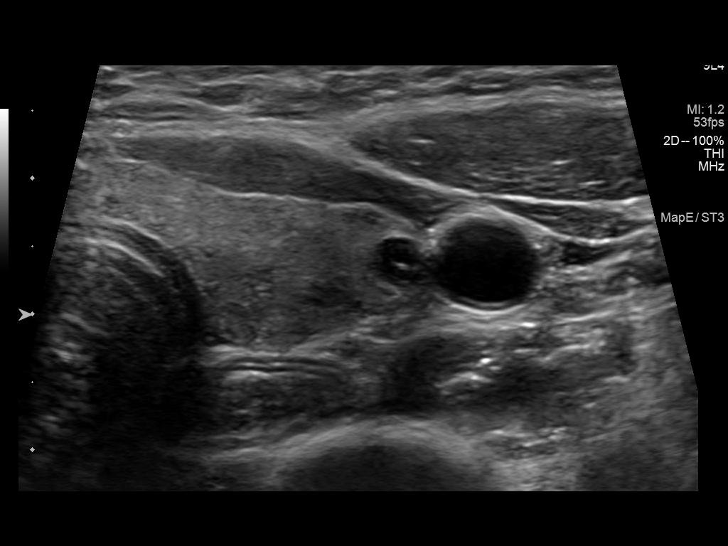
[im 43/58]
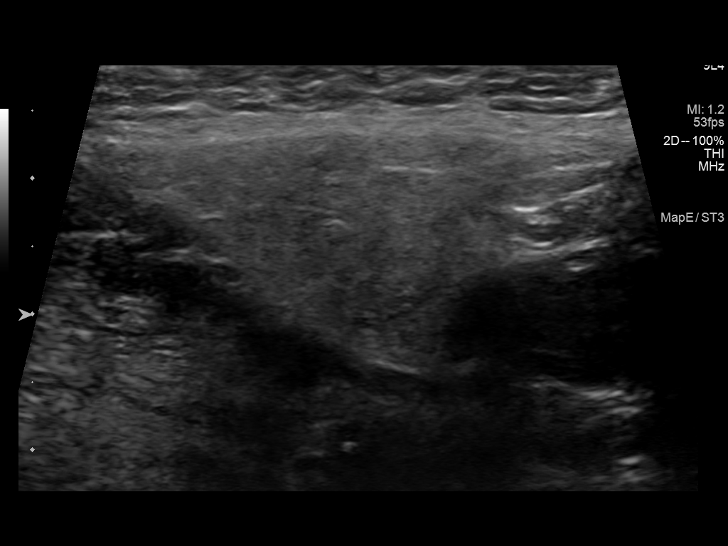
[im 48/58]
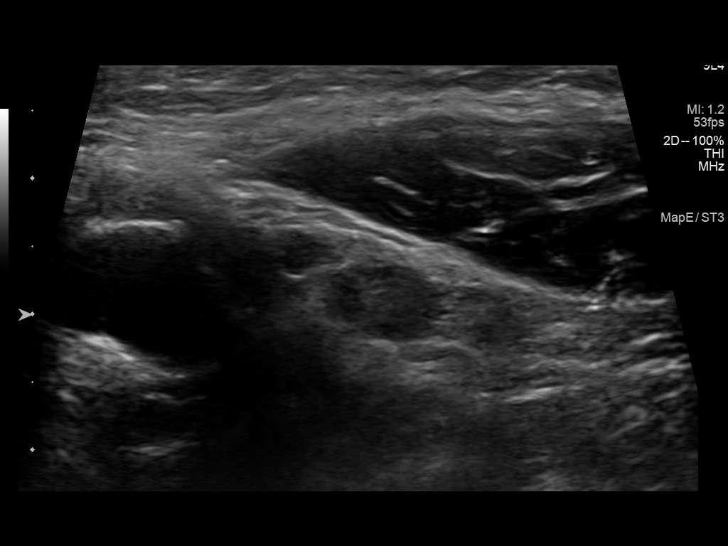
[im 53/58]
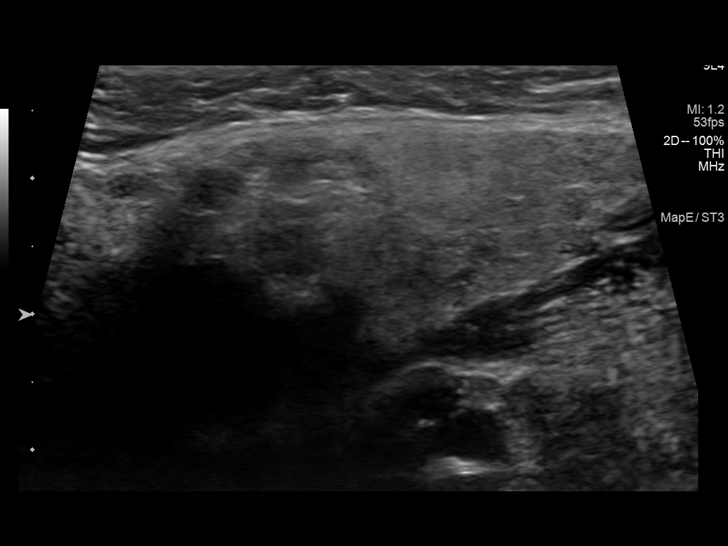
[im 58/58]
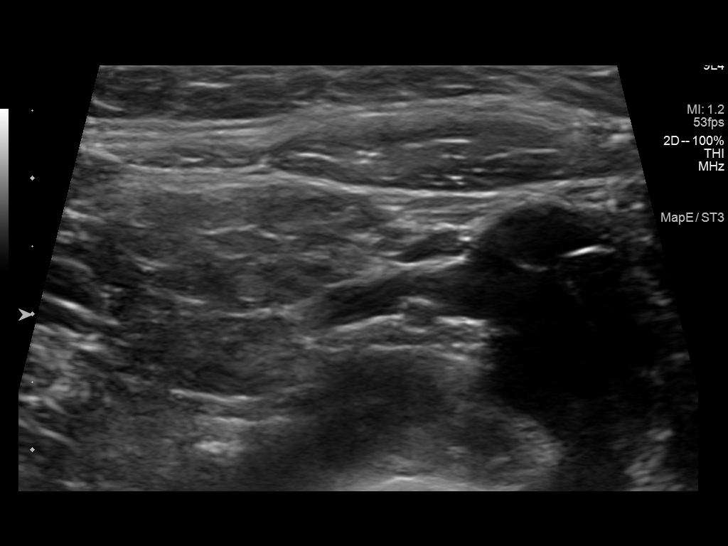

[14 of 25 positions shown; findings below may reference images not displayed]

FINDINGS: Parenchymal Echotexture: Normal

Isthmus: 0.4 cm

Right lobe: 5.8 x 1.3 x 1.9 cm

Left lobe: Uterus 6.1 x 1.2 x 1.8 cm

_________________________________________________________

Estimated total number of nodules >/= 1 cm: 0

Number of spongiform nodules >/=  2 cm not described below (TR1): 0

Number of mixed cystic and solid nodules >/= 1.5 cm not described
below (TR2): 0

_________________________________________________________

A small (0.5 cm) colloid cyst within the LEFT inferior gland is
present.

No discrete nodules are seen within the thyroid gland.

No cervical adenopathy or abnormal fluid collection within the
imaged neck.
IMPRESSION: Normal appearance of thyroid gland without discrete nodule.

## 2023-12-04 ENCOUNTER — Ambulatory Visit (INDEPENDENT_AMBULATORY_CARE_PROVIDER_SITE_OTHER): Admitting: Psychology

## 2023-12-04 ENCOUNTER — Encounter: Payer: Self-pay | Admitting: Psychology

## 2023-12-04 DIAGNOSIS — F331 Major depressive disorder, recurrent, moderate: Secondary | ICD-10-CM

## 2023-12-04 DIAGNOSIS — F419 Anxiety disorder, unspecified: Secondary | ICD-10-CM | POA: Diagnosis not present

## 2023-12-04 NOTE — Progress Notes (Unsigned)
   Jenny Weber, [facility]

## 2023-12-04 NOTE — Progress Notes (Unsigned)
 [facility] Counselor/Therapist Progress Note  Patient ID: Jenny Weber, MRN: [id number],    Date: 12/04/2023  Time Spent: 10:03am-10:57am   Pt is seen for a virtual video visit via caregility. Pt joins from her home, reporting privacy and counselor from her home office.  Pt consents to virtual visit and is aware of limitations of such visits.    Treatment Type: Individual Therapy  Reported Symptoms: pt reports emotional morning- feeling down.  Mental Status Exam: Appearance:  Well Groomed     Behavior: Appropriate  Motor: Normal  Speech/Language:  Clear and Coherent and Normal Rate  Affect: Appropriate  Mood: depressed  Thought process: normal  Thought content:   WNL  Sensory/Perceptual disturbances:   WNL  Orientation: oriented to person, place, time/date, and situation  Attention: Good  Concentration: Good  Memory: WNL  Fund of knowledge:  Good  Insight:   Good  Judgment:  Good  Impulse Control: Good   Risk Assessment: Danger to Self:  No Self-injurious Behavior: No Danger to Others: No Duty to Warn:no Physical Aggression / Violence:No  Access to Firearms a concern: No  Gang Involvement:No   Subjective: counselor assessed pt current functioning per pt report. Developed tx plan with pt input and gained pt consent. Processed stressors and related thoughts.  Explored interactions w/ family and discussed boundaries for pt to establish.  Pt affect wnl.  Pt reports she had been very busy over the weekend w/ watching her niece and helping with music interests.  Pt reports that was exhausted yesterday.  Pt reports this morning feeling disappointment and down.  Pt reports that sought back up from mom to assist if appointment runs over and mom declined.  Pt reports on pattern on family always seeking her to be there and not able to support her when needed.  Pt reports pattern w/ mom that extends back to growing up in house.  Pt also reports that talked w/ maternal  grandmother and learned more from her perspective about dad's absence and mom making very difficult with visitations. Pt discussed need for boundaries to take care of self and working to establish w/out guilt.      Interventions: Cognitive Behavioral Therapy and supportive  Diagnosis:Anxiety disorder, unspecified type  Major depressive disorder, recurrent episode, moderate (HCC)  Plan: Pt to f/u in 2 weeks for counseling.  Pt to f/u w/ PCP for medication management.   Individualized Treatment Plan Strengths: drawing, listen to music, dancing, enjoys fishing, love animals and love kids and love to cook.  Sit outside when cloudy. Enjoys nature. enjoys Art gallery manager.   Supports: her boyfriend- at times her mom and siblings.   Goal/Needs for Treatment:  In order of importance to patient 1) increase coping and self care skills 2) have safe space to talk about past 3) ---   Client Statement of Needs: Pt better ways to cope with things.  I feel like people I got close to all left me.  Mentally I'm exhausted.  I'm not coping and just getting up and doing things.  I want to develop healthy habits. I want to have someone to talk to be vunerable and that I don't have to leave out details or feel judge.     Treatment Level:outpatient counseling  Symptoms:anxiety, worries, fatigue and depressed moods  Client Treatment Preferences: biweekly counseling and continue w/ med management w/ PCP   Healthcare consumer's goal for treatment:  Counselor, [name], [facility] will support the patient's ability to achieve the goals  identified. Cognitive Behavioral Therapy, Assertive Communication/Conflict Resolution Training, Relaxation Training, ACT, Humanistic and other evidenced-based practices will be used to promote progress towards healthy functioning.   Healthcare consumer will: Actively participate in therapy, working towards healthy functioning.    *Justification for Continuation/Discontinuation of Goal:  R=Revised, O=Ongoing, A=Achieved, D=Discontinued  Goal 1) Increase effective coping skills, recognize/reframe distortions and build healthy self care skills to manage anxiety and depression AEB pt reports and therapist observation.   Baseline date 12/04/23: Progress towards goal 0; How Often - Daily Target Date Goal Was reviewed Status Code Progress towards goal/Likert rating  12/03/24                Goal 2) maintain safe space to increase verbal expression of past stressors/trauma and insight into impact for pt AEB pt report and therapist observation.  Baseline date 12/04/23: Progress towards goal 0; How Often - Daily Target Date Goal Was reviewed Status Code Progress towards goal  12/03/24                  This plan has been reviewed and created by the following participants:  This plan will be reviewed at least every 12 months. Date Behavioral Health Clinician Date Guardian/Patient   12/04/23  [facility] [name] [facility] 12/04/23 Verbal Consent Provided and electronic signature obtained                    [name] APPL [facility]

## 2023-12-19 ENCOUNTER — Ambulatory Visit (INDEPENDENT_AMBULATORY_CARE_PROVIDER_SITE_OTHER): Admitting: Psychology

## 2023-12-19 DIAGNOSIS — F331 Major depressive disorder, recurrent, moderate: Secondary | ICD-10-CM | POA: Diagnosis not present

## 2023-12-19 DIAGNOSIS — F419 Anxiety disorder, unspecified: Secondary | ICD-10-CM

## 2023-12-19 NOTE — Progress Notes (Signed)
 [facility] Counselor/Therapist Progress Note  Patient ID: Jenny Weber, MRN: [id number],    Date: 12/19/2023  Time Spent: 10:01am-10:56am   Pt is seen for a virtual video visit via caregility. Pt joins from her home, reporting privacy and counselor from her home office.  Pt consents to virtual visit and is aware of limitations of such visits.    Treatment Type: Individual Therapy  Reported Symptoms: pt reports rumination on past and worries w/ family and setting boundaries.   Mental Status Exam: Appearance:  Well Groomed     Behavior: Appropriate  Motor: Normal  Speech/Language:  Clear and Coherent and Normal Rate  Affect: Appropriate  Mood: anxious  Thought process: normal  Thought content:   WNL  Sensory/Perceptual disturbances:   WNL  Orientation: oriented to person, place, time/date, and situation  Attention: Good  Concentration: Good  Memory: WNL  Fund of knowledge:  Good  Insight:   Good  Judgment:  Good  Impulse Control: Good   Risk Assessment: Danger to Self:  No Self-injurious Behavior: No Danger to Others: No Duty to Warn:no Physical Aggression / Violence:No  Access to Firearms a concern: No  Gang Involvement:No   Subjective: counselor assessed pt current functioning per pt report. Processed positives and stressors and emotions related.   Assisted pt in naming emotions and ways of expressing.  Discussed ways of setting some boundaries and acknowledging not responsible for other feelings/reactions.  Provided psychoeducation re: past trauma and how has impacted.  Pt affect wnl.  Pt reports good start to day.  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 Pt discussed how positive step for her.  Pt discussed continued family expectations and wondering if she can set boundaries for her time off work.  Pt discussed guilt feels and worry that something will go wrong.  Pt reports difficult expressing feelings to  family and recognized how suppressed when younger and messages of not ok to express feelings.    Interventions: Cognitive Behavioral Therapy and supportive  Diagnosis:Anxiety disorder, unspecified type  Major depressive disorder, recurrent episode, moderate (HCC)  Plan: Pt to f/u in 2 weeks for counseling.  Pt to f/u w/ PCP for medication management.   Individualized Treatment Plan Strengths: drawing, listen to music, dancing, enjoys fishing, love animals and love kids and love to cook.  Sit outside when cloudy. Enjoys nature. enjoys Art gallery manager.   Supports: her boyfriend- at times her mom and siblings.   Goal/Needs for Treatment:  In order of importance to patient 1) increase coping and self care skills 2) have safe space to talk about past 3) ---   Client Statement of Needs: Pt better ways to cope with things.  I feel like people I got close to all left me.  Mentally I'm exhausted.  I'm not coping and just getting up and doing things.  I want to develop healthy habits. I want to have someone to talk to be vunerable and that I don't have to leave out details or feel judge.     Treatment Level:outpatient counseling  Symptoms:anxiety, worries, fatigue and depressed moods  Client Treatment Preferences: biweekly counseling and continue w/ med management w/ PCP   Healthcare consumer's goal for treatment:  Counselor, [name], [facility] will support the patient's ability to achieve the goals identified. Cognitive Behavioral Therapy, Assertive Communication/Conflict Resolution Training, Relaxation Training, ACT, Humanistic and other evidenced-based practices will be used to promote progress towards healthy functioning.   Healthcare consumer  will: Actively participate in therapy, working towards healthy functioning.    *Justification for Continuation/Discontinuation of Goal: R=Revised, O=Ongoing, A=Achieved, D=Discontinued  Goal 1) Increase effective coping skills, recognize/reframe  distortions and build healthy self care skills to manage anxiety and depression AEB pt reports and therapist observation.   Baseline date 12/04/23: Progress towards goal 0; How Often - Daily Target Date Goal Was reviewed Status Code Progress towards goal/Likert rating  12/03/24                Goal 2) maintain safe space to increase verbal expression of past stressors/trauma and insight into impact for pt AEB pt report and therapist observation.  Baseline date 12/04/23: Progress towards goal 0; How Often - Daily Target Date Goal Was reviewed Status Code Progress towards goal  12/03/24                  This plan has been reviewed and created by the following participants:  This plan will be reviewed at least every 12 months. Date Behavioral Health Clinician Date Guardian/Patient   12/04/23  [facility] [name] [facility] 12/04/23 Verbal Consent Provided and electronic signature obtained                      [name] APPL [facility]

## 2024-01-02 ENCOUNTER — Ambulatory Visit (INDEPENDENT_AMBULATORY_CARE_PROVIDER_SITE_OTHER): Admitting: Psychology

## 2024-01-02 DIAGNOSIS — F331 Major depressive disorder, recurrent, moderate: Secondary | ICD-10-CM | POA: Diagnosis not present

## 2024-01-02 DIAGNOSIS — F419 Anxiety disorder, unspecified: Secondary | ICD-10-CM | POA: Diagnosis not present

## 2024-01-02 NOTE — Progress Notes (Signed)
 [facility] Counselor/Therapist Progress Note  Patient ID: Jenny Weber, MRN: [id number],    Date: 01/02/2024  Time Spent: 12:01pm-12:55pm   Pt is seen for a virtual video visit via caregility. Pt joins from her home, reporting privacy and counselor from her home office.  Pt consents to virtual visit and is aware of limitations of such visits.    Treatment Type: Individual Therapy  Reported Symptoms: pt reports some frustrations in relationship.  Mental Status Exam: Appearance:  Well Groomed     Behavior: Appropriate  Motor: Normal  Speech/Language:  Clear and Coherent and Normal Rate  Affect: Appropriate  Mood: irritable  Thought process: normal  Thought content:   WNL  Sensory/Perceptual disturbances:   WNL  Orientation: oriented to person, place, time/date, and situation  Attention: Good  Concentration: Good  Memory: WNL  Fund of knowledge:  Good  Insight:   Good  Judgment:  Good  Impulse Control: Good   Risk Assessment: Danger to Self:  No Self-injurious Behavior: No Danger to Others: No Duty to Warn:no Physical Aggression / Violence:No  Access to Firearms a concern: No  Gang Involvement:No   Subjective: counselor assessed pt current functioning per pt report. Processed positives and stressors and related interactions.  Assisted pt in exploring what values in relationship and what struggles w/ given past interactions.  Discussed effective communication and asking for what needs/wants.  Pt affect wnl.  Pt reports hasn't started job as just cleared background checks and should start in next week.  Pt reports good boundaries w/ family recent.  Pt reports stress in her relationship.  Pt reports has been staying more w/ friend who she will drive to work w/ and boyfriend has been checking up a lot on her.  Pt has communicated to boyfriend and also aware that she is quick to shut down and be guarded.  Pt reflected on what needs in relationship and what wants  to communicate w/ boyfriend.      Interventions: Cognitive Behavioral Therapy, Assertiveness/Communication, and supportive  Diagnosis:Anxiety disorder, unspecified type  Major depressive disorder, recurrent episode, moderate (HCC)  Plan: Pt to f/u in 2 weeks for counseling.  Pt to f/u w/ PCP for medication management.   Individualized Treatment Plan Strengths: drawing, listen to music, dancing, enjoys fishing, love animals and love kids and love to cook.  Sit outside when cloudy. Enjoys nature. enjoys Art gallery manager.   Supports: her boyfriend- at times her mom and siblings.   Goal/Needs for Treatment:  In order of importance to patient 1) increase coping and self care skills 2) have safe space to talk about past 3) ---   Client Statement of Needs: Pt better ways to cope with things.  I feel like people I got close to all left me.  Mentally I'm exhausted.  I'm not coping and just getting up and doing things.  I want to develop healthy habits. I want to have someone to talk to be vunerable and that I don't have to leave out details or feel judge.     Treatment Level:outpatient counseling  Symptoms:anxiety, worries, fatigue and depressed moods  Client Treatment Preferences: biweekly counseling and continue w/ med management w/ PCP   Healthcare consumer's goal for treatment:  Counselor, [name], [facility] will support the patient's ability to achieve the goals identified. Cognitive Behavioral Therapy, Assertive Communication/Conflict Resolution Training, Relaxation Training, ACT, Humanistic and other evidenced-based practices will be used to promote progress towards healthy functioning.   Healthcare consumer will: Actively participate  in therapy, working towards healthy functioning.    *Justification for Continuation/Discontinuation of Goal: R=Revised, O=Ongoing, A=Achieved, D=Discontinued  Goal 1) Increase effective coping skills, recognize/reframe distortions and build healthy self care  skills to manage anxiety and depression AEB pt reports and therapist observation.   Baseline date 12/04/23: Progress towards goal 0; How Often - Daily Target Date Goal Was reviewed Status Code Progress towards goal/Likert rating  12/03/24                Goal 2) maintain safe space to increase verbal expression of past stressors/trauma and insight into impact for pt AEB pt report and therapist observation.  Baseline date 12/04/23: Progress towards goal 0; How Often - Daily Target Date Goal Was reviewed Status Code Progress towards goal  12/03/24                  This plan has been reviewed and created by the following participants:  This plan will be reviewed at least every 12 months. Date Behavioral Health Clinician Date Guardian/Patient   12/04/23  [facility] [name] [facility] 12/04/23 Verbal Consent Provided and electronic signature obtained                       [name] APPL [facility]

## 2024-01-12 ENCOUNTER — Telehealth: Payer: Self-pay | Admitting: Psychology

## 2024-01-12 ENCOUNTER — Ambulatory Visit: Admitting: Psychology

## 2024-01-12 NOTE — Telephone Encounter (Signed)
 This Clinical research associate contacted the patient via telephone at 9:05am to check-in as they did not present for the appointment. The patient was unable to be reached and her voicemail was setup. A voicemail was unable to be left.

## 2024-01-12 NOTE — Progress Notes (Deleted)
 This Clinical research associate contacted the patient via telephone at 9:05am to check-in as they did not present for the appointment. The patient was unable to be reached and her voicemail was setup. A voicemail was unable to be left.

## 2024-01-23 ENCOUNTER — Ambulatory Visit (INDEPENDENT_AMBULATORY_CARE_PROVIDER_SITE_OTHER): Admitting: Psychology

## 2024-01-23 DIAGNOSIS — F419 Anxiety disorder, unspecified: Secondary | ICD-10-CM | POA: Diagnosis not present

## 2024-01-23 DIAGNOSIS — F331 Major depressive disorder, recurrent, moderate: Secondary | ICD-10-CM | POA: Diagnosis not present

## 2024-01-23 NOTE — Progress Notes (Signed)
 [facility] Counselor/Therapist Progress Note  Patient ID: Jenny Weber, MRN: [id number],    Date: 01/23/2024  Time Spent: 8:00am-8:43am   Pt is seen for a virtual video visit via caregility. Pt joins from her home, reporting privacy and counselor from her home office.  Pt consents to virtual visit and is aware of limitations of such visits.    Treatment Type: Individual Therapy  Reported Symptoms: pt reports difficulty setting boundaries w/ family/friends.  Mental Status Exam: Appearance:  Well Groomed     Behavior: Appropriate  Motor: Normal  Speech/Language:  Clear and Coherent and Normal Rate  Affect: Appropriate  Mood: anxious  Thought process: normal  Thought content:   WNL  Sensory/Perceptual disturbances:   WNL  Orientation: oriented to person, place, time/date, and situation  Attention: Good  Concentration: Good  Memory: WNL  Fund of knowledge:  Good  Insight:   Good  Judgment:  Good  Impulse Control: Good   Risk Assessment: Danger to Self:  No Self-injurious Behavior: No Danger to Others: No Duty to Warn:no Physical Aggression / Violence:No  Access to Firearms a concern: No  Gang Involvement:No   Subjective: counselor assessed pt current functioning per pt report. Processed positives and stressors.  Explored w/ pt interactions w/ family and friends.  Discussed boundaries setting and ways to communicate.  Identified w/ pt needs and begin to budget for her goals towards vehicle.  Pt affect wnl.  Pt reports she has started her job and is enjoying.   Pt reports she continued stressors w/ mom expectations.  Pt reports she has working to set boundaries and not get in middle of family disagreements.  Pt recognized that she has difficulty not giving to others if she has but in turn won't be able to meet her own needs.  Pt reports she is looking to rent apartment w/ friend and does have some worry as friend likes her to always join her and pt needs her  alone time.  Pt discussed her goal for getting car and needs to budget and be disciplined w/ money to work towards this goal.  Pt considers opening account that can place monies in from each paycheck that doesn't touch.    Interventions: Cognitive Behavioral Therapy, Assertiveness/Communication, Solution-Oriented/Positive Psychology, and supportive  Diagnosis:Anxiety disorder, unspecified type  Major depressive disorder, recurrent episode, moderate (HCC)  Plan: Pt to f/u in 2 weeks for counseling.  Pt to f/u w/ PCP for medication management.   Individualized Treatment Plan Strengths: drawing, listen to music, dancing, enjoys fishing, love animals and love kids and love to cook.  Sit outside when cloudy. Enjoys nature. enjoys Art gallery manager.   Supports: her boyfriend- at times her mom and siblings.   Goal/Needs for Treatment:  In order of importance to patient 1) increase coping and self care skills 2) have safe space to talk about past 3) ---   Client Statement of Needs: Pt better ways to cope with things.  I feel like people I got close to all left me.  Mentally I'm exhausted.  I'm not coping and just getting up and doing things.  I want to develop healthy habits. I want to have someone to talk to be vunerable and that I don't have to leave out details or feel judge.     Treatment Level:outpatient counseling  Symptoms:anxiety, worries, fatigue and depressed moods  Client Treatment Preferences: biweekly counseling and continue w/ med management w/ PCP   Healthcare consumer's goal for treatment:  Counselor, [name], [facility] will support the patient's ability to achieve the goals identified. Cognitive Behavioral Therapy, Assertive Communication/Conflict Resolution Training, Relaxation Training, ACT, Humanistic and other evidenced-based practices will be used to promote progress towards healthy functioning.   Healthcare consumer will: Actively participate in therapy, working towards healthy  functioning.    *Justification for Continuation/Discontinuation of Goal: R=Revised, O=Ongoing, A=Achieved, D=Discontinued  Goal 1) Increase effective coping skills, recognize/reframe distortions and build healthy self care skills to manage anxiety and depression AEB pt reports and therapist observation.   Baseline date 12/04/23: Progress towards goal 0; How Often - Daily Target Date Goal Was reviewed Status Code Progress towards goal/Likert rating  12/03/24                Goal 2) maintain safe space to increase verbal expression of past stressors/trauma and insight into impact for pt AEB pt report and therapist observation.  Baseline date 12/04/23: Progress towards goal 0; How Often - Daily Target Date Goal Was reviewed Status Code Progress towards goal  12/03/24                  This plan has been reviewed and created by the following participants:  This plan will be reviewed at least every 12 months. Date Behavioral Health Clinician Date Guardian/Patient   12/04/23  [facility] Herald [facility] 12/04/23 Verbal Consent Provided and electronic signature obtained                       HERALD DAMIEN [facility]

## 2024-02-08 ENCOUNTER — Ambulatory Visit: Admitting: Psychology

## 2024-03-07 ENCOUNTER — Ambulatory Visit: Admitting: Family

## 2024-03-28 ENCOUNTER — Ambulatory Visit: Admitting: Family

## 2024-05-22 ENCOUNTER — Ambulatory Visit (HOSPITAL_COMMUNITY): Admission: EM | Admit: 2024-05-22 | Discharge: 2024-05-22 | Disposition: A | Discharge status: Home / Self Care

## 2024-05-22 ENCOUNTER — Encounter (HOSPITAL_COMMUNITY): Payer: Self-pay

## 2024-05-22 DIAGNOSIS — B349 Viral infection, unspecified: Secondary | ICD-10-CM | POA: Diagnosis not present

## 2024-05-22 DIAGNOSIS — N939 Abnormal uterine and vaginal bleeding, unspecified: Secondary | ICD-10-CM | POA: Diagnosis not present

## 2024-05-22 LAB — POC COVID19/FLU A&B COMBO
Covid Antigen, POC: NEGATIVE
Influenza A Antigen, POC: NEGATIVE
Influenza B Antigen, POC: NEGATIVE

## 2024-05-22 LAB — POCT URINE DIPSTICK
Glucose, UA: NEGATIVE mg/dL
Leukocytes, UA: NEGATIVE
Nitrite, UA: NEGATIVE
POC PROTEIN,UA: >=300 — AB
Spec Grav, UA: >=1.03 — AB (ref 1.010–1.025)
Urobilinogen, UA: 1 U/dL
pH, UA: 6 (ref 5.0–8.0)

## 2024-05-22 LAB — POCT URINE PREGNANCY: Preg Test, Ur: NEGATIVE

## 2024-05-22 MED ORDER — MEDROXYPROGESTERONE ACETATE 10 MG PO TABS: 10.0000 mg | ORAL_TABLET | Freq: Three times a day (TID) | ORAL | 0 refills | Status: AC

## 2024-05-22 MED ORDER — IBUPROFEN 800 MG PO TABS: 800.0000 mg | ORAL_TABLET | Freq: Three times a day (TID) | ORAL | 0 refills | Status: AC

## 2024-05-22 MED ORDER — MEDROXYPROGESTERONE ACETATE 10 MG PO TABS: 10.0000 mg | ORAL_TABLET | Freq: Every day | ORAL | 3 refills | Status: AC

## 2024-05-22 MED ORDER — PROMETHAZINE-DM 6.25-15 MG/5ML PO SYRP: 10.0000 mL | ORAL_SOLUTION | Freq: Three times a day (TID) | ORAL | 0 refills | Status: AC | PRN

## 2024-05-22 MED ORDER — AZELASTINE HCL 0.1 % NA SOLN: 1.0000 | Freq: Two times a day (BID) | NASAL | 0 refills | Status: AC

## 2024-05-22 NOTE — ED Triage Notes (Signed)
 Pt c/o cough, body aches, chills, sweats, and fevers since Saturday night. C/o decrease appetite and can't sleep. States exposure to Flu. States took Robitussin last night with relief.   States had her MC on 12/1-12/7 and started spotting last night.

## 2024-05-22 NOTE — Discharge Instructions (Addendum)
°  1. Acute viral syndrome (Primary) - POC Covid19/Flu A&B Antigen complete in UC is negative for COVID and influenza - azelastine  (ASTELIN ) 0.1 % nasal spray; Place 1 spray into both nostrils 2 (two) times daily. Use in each nostril as directed  Dispense: 30 mL; Refill: 0 - promethazine -dextromethorphan (PROMETHAZINE -DM) 6.25-15 MG/5ML syrup; Take 10 mLs by mouth 3 (three) times daily as needed for cough.  Dispense: 240 mL; Refill: 0 - ibuprofen  (ADVIL ) 800 MG tablet; Take 1 tablet (800 mg total) by mouth 3 (three) times daily.  Dispense: 30 tablet; Refill: 0  2. Abnormal uterine bleeding (AUB) - POCT urine pregnancy complete in UC is negative - POC Urinalysis Dipstick shows moderate blood, no nitrates, no leukocytes, these findings are not indicative of urinary infection. - Urine Culture sent to lab for further testing results should be available in 2 to 3 days. - medroxyPROGESTERone  (PROVERA ) 10 MG tablet; Take 1 tablet (10 mg total) by mouth 3 (three) times daily.  Dispense: 21 tablet; Refill: 0 - medroxyPROGESTERone  (PROVERA ) 10 MG tablet; Take 1 tablet (10 mg total) by mouth daily. Do not take more than 3 doses in 24 hours.  If bleeding is still not controlled with this dose follow-up with gynecology for further treatment.  Dispense: 30 tablet; Refill: 3 - Ambulatory referral to Gynecology for follow-up evaluation and management for ongoing abnormal uterine bleeding.  -Continue to monitor symptoms for any change in severity if there is any escalation of current symptoms or development of new symptoms follow-up in ER for further evaluation and management.

## 2024-05-22 NOTE — ED Provider Notes (Signed)
 [facility]  Note:  This document was prepared using Conservation officer, historic buildings and may include unintentional dictation errors.  MRN: [id number] DOB: 2001/02/02  Subjective:   Jenny Weber is a 23 y.o. female presenting for cough, body aches, fever/chills, night sweats, loss of appetite x 2 to 3 days.  Patient reports that she has been taking Robitussin with mild relief.  States that she had known exposure to influenza was concerned that she may have developed virus.  Patient reports that she is having difficulty sleeping due to cough and bodyaches.  Patient also reports that she started having vaginal spotting last night after finishing her period on 05/12/2024.  Patient denies any severe abdominal cramping or pain, no flank pain, no diarrhea, no constipation.  Current Medications[1]   Allergies[2]  Past Medical History:  Diagnosis Date   Allergy    Diabetes mellitus without complication (HCC)    BORDERLINE   Hypertension      History reviewed. No pertinent surgical history.  Family History  Problem Relation Age of Onset   ADD / ADHD Mother    Anxiety disorder Mother    Hypertension Father    Autism spectrum disorder Brother    ADD / ADHD Maternal Uncle    Hypertension Maternal Grandfather    Hypertension Maternal Grandmother    Hypertension Paternal Grandfather    Hypertension Paternal Grandmother     Social History[3]  ROS Refer to HPI for ROS details.  Objective:    Vitals: BP 110/76 (BP Location: Right Arm)   Pulse (!) 116   Temp 98.9 F (37.2 C)   Resp 18   LMP 05/06/2024 (Exact Date)   SpO2 97%   Physical Exam Vitals and nursing note reviewed.  Constitutional:      General: She is not in acute distress.    Appearance: Normal appearance. She is well-developed. She is not ill-appearing or toxic-appearing.  HENT:     Head: Normocephalic and atraumatic.     Nose: Congestion present. No rhinorrhea.     Mouth/Throat:      Mouth: Mucous membranes are moist.     Pharynx: No oropharyngeal exudate or posterior oropharyngeal erythema.  Cardiovascular:     Rate and Rhythm: Normal rate.  Pulmonary:     Effort: Pulmonary effort is normal. No respiratory distress.     Breath sounds: No stridor. No wheezing.  Chest:     Chest wall: No tenderness.  Abdominal:     General: There is no distension.     Tenderness: There is no abdominal tenderness. There is no right CVA tenderness or left CVA tenderness.  Skin:    General: Skin is warm and dry.  Neurological:     General: No focal deficit present.     Mental Status: She is alert and oriented to person, place, and time.  Psychiatric:        Mood and Affect: Mood normal.        Behavior: Behavior normal.     Procedures  Results for orders placed or performed during the hospital encounter of 05/22/24 (from the past 24 hours)  POC Covid19/Flu A&B Antigen     Status: None   Collection Time: 05/22/24  8:44 AM  Result Value Ref Range   Influenza A Antigen, POC Negative Negative   Influenza B Antigen, POC Negative Negative   Covid Antigen, POC Negative Negative  POCT urine pregnancy     Status: None   Collection Time: 05/22/24 10:07 AM  Result Value Ref Range   Preg Test, Ur Negative Negative  POC Urinalysis Dipstick     Status: Abnormal   Collection Time: 05/22/24 10:07 AM  Result Value Ref Range   Color, UA straw (A) yellow   Clarity, UA hazy (A) clear   Glucose, UA negative negative mg/dL   Bilirubin, UA moderate (A) negative   Ketones, POC UA small (15) (A) negative mg/dL   Spec Grav, UA >=8.969 (A) 1.010 - 1.025   Blood, UA moderate (A) negative   pH, UA 6.0 5.0 - 8.0   POC PROTEIN,UA >=300 (A) negative, trace   Urobilinogen, UA 1.0 0.2 or 1.0 E.U./dL   Nitrite, UA Negative Negative   Leukocytes, UA Negative Negative    Assessment and Plan :     Discharge Instructions       1. Acute viral syndrome (Primary) - POC Covid19/Flu A&B Antigen  complete in UC is negative for COVID and influenza - azelastine  (ASTELIN ) 0.1 % nasal spray; Place 1 spray into both nostrils 2 (two) times daily. Use in each nostril as directed  Dispense: 30 mL; Refill: 0 - promethazine -dextromethorphan (PROMETHAZINE -DM) 6.25-15 MG/5ML syrup; Take 10 mLs by mouth 3 (three) times daily as needed for cough.  Dispense: 240 mL; Refill: 0 - ibuprofen  (ADVIL ) 800 MG tablet; Take 1 tablet (800 mg total) by mouth 3 (three) times daily.  Dispense: 30 tablet; Refill: 0  2. Abnormal uterine bleeding (AUB) - POCT urine pregnancy complete in UC is negative - POC Urinalysis Dipstick shows moderate blood, no nitrates, no leukocytes, these findings are not indicative of urinary infection. - Urine Culture sent to lab for further testing results should be available in 2 to 3 days. - medroxyPROGESTERone  (PROVERA ) 10 MG tablet; Take 1 tablet (10 mg total) by mouth 3 (three) times daily.  Dispense: 21 tablet; Refill: 0 - medroxyPROGESTERone  (PROVERA ) 10 MG tablet; Take 1 tablet (10 mg total) by mouth daily. Do not take more than 3 doses in 24 hours.  If bleeding is still not controlled with this dose follow-up with gynecology for further treatment.  Dispense: 30 tablet; Refill: 3 - Ambulatory referral to Gynecology for follow-up evaluation and management for ongoing abnormal uterine bleeding.  -Continue to monitor symptoms for any change in severity if there is any escalation of current symptoms or development of new symptoms follow-up in ER for further evaluation and management.      Delfin Squillace B [place]    [1] No current facility-administered medications for this encounter.  Current Outpatient Medications:    azelastine  (ASTELIN ) 0.1 % nasal spray, Place 1 spray into both nostrils 2 (two) times daily. Use in each nostril as directed, Disp: 30 mL, Rfl: 0   ibuprofen  (ADVIL ) 800 MG tablet, Take 1 tablet (800 mg total) by mouth 3 (three) times daily., Disp: 30 tablet, Rfl: 0    medroxyPROGESTERone  (PROVERA ) 10 MG tablet, Take 1 tablet (10 mg total) by mouth 3 (three) times daily., Disp: 21 tablet, Rfl: 0   medroxyPROGESTERone  (PROVERA ) 10 MG tablet, Take 1 tablet (10 mg total) by mouth daily. Do not take more than 3 doses in 24 hours.  If bleeding is still not controlled with this dose follow-up with gynecology for further treatment., Disp: 30 tablet, Rfl: 3   promethazine -dextromethorphan (PROMETHAZINE -DM) 6.25-15 MG/5ML syrup, Take 10 mLs by mouth 3 (three) times daily as needed for cough., Disp: 240 mL, Rfl: 0   FLUoxetine  (PROZAC ) 40 MG capsule, Take 1 capsule (40 mg total) by mouth  every morning. (Patient not taking: Reported on 05/22/2024), Disp: 90 capsule, Rfl: 3   gabapentin  (NEURONTIN ) 100 MG capsule, Take 2 capsules (200 mg total) by mouth at bedtime. (Patient not taking: Reported on 05/22/2024), Disp: 180 capsule, Rfl: 3   tizanidine  (ZANAFLEX ) 2 MG capsule, Take 1 capsule (2 mg total) by mouth every 8 (eight) hours. (Patient not taking: Reported on 05/22/2024), Disp: 30 capsule, Rfl: 1 [2]  Allergies Allergen Reactions   Other Swelling    mushrooms   Shellfish Allergy Itching and Swelling  [3]  Social History Tobacco Use   Smoking status: Every Day    Types: Cigarettes   Smokeless tobacco: Never  Vaping Use   Vaping status: Some Days  Substance Use Topics   Alcohol use: No   Drug use: No     Aurea Goodell B, NP 05/22/24 1050

## 2024-05-23 ENCOUNTER — Encounter: Admitting: Family

## 2024-05-23 LAB — URINE CULTURE: Special Requests: NORMAL
